# Patient Record
Sex: Male | Born: 1985 | Race: White | Hispanic: No | Marital: Single | Smoking: Never smoker
Health system: Southern US, Community
[De-identification: ages and names within clinical notes are randomized; demographics above are authoritative.]

## PROBLEM LIST (undated history)

## (undated) DIAGNOSIS — I471 Supraventricular tachycardia, unspecified: Secondary | ICD-10-CM

## (undated) DIAGNOSIS — Z952 Presence of prosthetic heart valve: Secondary | ICD-10-CM

## (undated) HISTORY — PX: CARDIAC SURGERY: SHX584

## (undated) HISTORY — PX: AORTIC VALVE REPLACEMENT: SHX41

---

## 2012-08-15 ENCOUNTER — Emergency Department: Payer: Self-pay | Admitting: Emergency Medicine

## 2012-08-16 LAB — BASIC METABOLIC PANEL
Anion Gap: 10 (ref 7–16)
BUN: 7 mg/dL (ref 7–18)
Chloride: 106 mmol/L (ref 98–107)
Co2: 26 mmol/L (ref 21–32)
EGFR (African American): >60
Glucose: 89 mg/dL (ref 65–99)
Osmolality: 281 (ref 275–301)
Sodium: 142 mmol/L (ref 136–145)

## 2012-08-16 LAB — MAGNESIUM: Magnesium: 1.7 mg/dL — ABNORMAL LOW

## 2012-08-16 LAB — PHOSPHORUS: Phosphorus: 3.4 mg/dL (ref 2.5–4.9)

## 2012-08-18 ENCOUNTER — Ambulatory Visit: Payer: Self-pay | Admitting: Family Medicine

## 2014-11-26 ENCOUNTER — Emergency Department: Payer: Self-pay | Admitting: Emergency Medicine

## 2014-11-26 LAB — COMPREHENSIVE METABOLIC PANEL
ALBUMIN: 3.7 g/dL (ref 3.4–5.0)
ALK PHOS: 85 U/L
ALT: 33 U/L
Anion Gap: 10 (ref 7–16)
BILIRUBIN TOTAL: 0.7 mg/dL (ref 0.2–1.0)
BUN: 9 mg/dL (ref 7–18)
CREATININE: 0.98 mg/dL (ref 0.60–1.30)
Calcium, Total: 8.9 mg/dL (ref 8.5–10.1)
Chloride: 98 mmol/L (ref 98–107)
Co2: 25 mmol/L (ref 21–32)
EGFR (African American): >60
EGFR (Non-African Amer.): >60
GLUCOSE: 102 mg/dL — AB (ref 65–99)
Osmolality: 265 (ref 275–301)
Potassium: 3.4 mmol/L — ABNORMAL LOW (ref 3.5–5.1)
SGOT(AST): 35 U/L (ref 15–37)
Sodium: 133 mmol/L — ABNORMAL LOW (ref 136–145)
TOTAL PROTEIN: 8.7 g/dL — AB (ref 6.4–8.2)

## 2014-11-26 LAB — CBC
HCT: 49.6 % (ref 40.0–52.0)
HGB: 16.9 g/dL (ref 13.0–18.0)
MCH: 31.2 pg (ref 26.0–34.0)
MCHC: 34.1 g/dL (ref 32.0–36.0)
MCV: 91 fL (ref 80–100)
Platelet: 221 10*3/uL (ref 150–440)
RBC: 5.42 10*6/uL (ref 4.40–5.90)
RDW: 13.1 % (ref 11.5–14.5)
WBC: 10.3 10*3/uL (ref 3.8–10.6)

## 2014-11-26 LAB — SEDIMENTATION RATE: Erythrocyte Sed Rate: 21 mm/hr — ABNORMAL HIGH (ref 0–15)

## 2016-01-05 ENCOUNTER — Encounter: Payer: Self-pay | Admitting: Urgent Care

## 2016-01-05 ENCOUNTER — Emergency Department
Admission: EM | Admit: 2016-01-05 | Discharge: 2016-01-05 | Disposition: A | Discharge status: Home / Self Care | Payer: Managed Care, Other (non HMO) | Attending: Emergency Medicine | Admitting: Emergency Medicine

## 2016-01-05 DIAGNOSIS — S0083XA Contusion of other part of head, initial encounter: Secondary | ICD-10-CM

## 2016-01-05 DIAGNOSIS — Y9289 Other specified places as the place of occurrence of the external cause: Secondary | ICD-10-CM | POA: Diagnosis not present

## 2016-01-05 DIAGNOSIS — S01511A Laceration without foreign body of lip, initial encounter: Secondary | ICD-10-CM | POA: Insufficient documentation

## 2016-01-05 DIAGNOSIS — Y998 Other external cause status: Secondary | ICD-10-CM | POA: Insufficient documentation

## 2016-01-05 DIAGNOSIS — Y9389 Activity, other specified: Secondary | ICD-10-CM | POA: Insufficient documentation

## 2016-01-05 DIAGNOSIS — S0993XA Unspecified injury of face, initial encounter: Secondary | ICD-10-CM | POA: Diagnosis present

## 2016-01-05 MED ORDER — LIDOCAINE HCL (PF) 1 % IJ SOLN: 5.0000 mL | Freq: Once | INTRAMUSCULAR | Status: DC

## 2016-01-05 MED ORDER — LIDOCAINE HCL (PF) 1 % IJ SOLN
INTRAMUSCULAR | Status: AC
  Filled 2016-01-05: qty 5
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
## 2016-01-05 NOTE — Discharge Instructions (Signed)
Mouth Laceration A mouth laceration is a deep cut in the lining of your mouth (mucosa). The laceration may extend into your lip or go all of the way through your mouth and cheek. Lacerations inside your mouth may involve your tongue, the insides of your cheeks, or the upper surface of your mouth (palate). Mouth lacerations may bleed a lot because your mouth has a very rich blood supply. Mouth lacerations may need to be repaired with stitches (sutures). CAUSES Any type of facial injury can cause a mouth laceration. Common causes include:  Getting hit in the mouth.  Being in a car accident. SYMPTOMS The most common sign of a mouth laceration is bleeding that fills the mouth. DIAGNOSIS Your health care provider can diagnose a mouth laceration by examining your mouth. Your mouth may need to be washed out (irrigated) with a sterile salt-water (saline) solution. Your health care provider may also have to remove any blood clots to determine how bad your injury is. You may need X-rays of the bones in your jaw or your face to rule out other injuries, such as dental injuries, facial fractures, or jaw fractures. TREATMENT Treatment depends on the location and severity of your injury. Small mouth lacerations may not need treatment if bleeding has stopped. You may need sutures if:  You have a tongue laceration.  Your mouth laceration is large or deep, or it continues to bleed. If sutures are necessary, your health care provider will use absorbable sutures that dissolve as your body heals. You may also receive antibiotic medicine or a tetanus shot. HOME CARE INSTRUCTIONS  Take medicines only as directed by your health care provider.  If you were prescribed an antibiotic medicine, finish all of it even if you start to feel better.  Eat as directed by your health care provider. You may only be able to drink liquids or eat soft foods for a few days.  Rinse your mouth with a warm, salt-water rinse 4-6  times per day or as directed by your health care provider. You can make a salt-water rinse by mixing one tsp of salt into two cups of warm water.  Do not poke the sutures with your tongue. Doing that can loosen them.  Check your wound every day for signs of infection. It is normal to have a white or gray patch over your wound while it heals. Watch for:  Redness.  Swelling.  Blood or pus.  Maintain regular oral hygiene, if possible. Gently brush your teeth with a soft, nylon-bristled toothbrush 2 times per day.  Keep all follow-up visits as directed by your health care provider. This is important. SEEK MEDICAL CARE IF:  You were given a tetanus shot and have swelling, severe pain, redness, or bleeding at the injection site.  You have a fever.  Your pain is not controlled with medicine.  You have redness, swelling, or pain at your wound that is getting worse.  You have fresh bleeding or pus coming from your wound.  The edges of your wound break open.  You develop swollen, tender glands in your throat. SEEK IMMEDIATE MEDICAL CARE IF:   Your face or the area under your jaw becomes swollen.  You have trouble breathing or swallowing.   This information is not intended to replace advice given to you by your health care provider. Make sure you discuss any questions you have with your health care provider.   Document Released: 10/20/2005 Document Revised: 03/06/2015 Document Reviewed: 10/11/2014 Elsevier Interactive Patient  Education 2016 [employer] Inc[employer]   Facial or Scalp Contusion A facial or scalp contusion is a deep bruise on the face or head. Injuries to the face and head generally cause a lot of swelling, especially around the eyes. Contusions are the result of an injury that caused bleeding under the skin. The contusion may turn blue, purple, or yellow. Minor injuries will give you a painless contusion, but more severe contusions may stay painful and swollen for a few weeks.    CAUSES  A facial or scalp contusion is caused by a blunt injury or trauma to the face or head area.  SIGNS AND SYMPTOMS   Swelling of the injured area.   Discoloration of the injured area.   Tenderness, soreness, or pain in the injured area.  DIAGNOSIS  The diagnosis can be made by taking a medical history and doing a physical exam. An X-ray exam, CT scan, or MRI may be needed to determine if there are any associated injuries, such as broken bones (fractures). TREATMENT  Often, the best treatment for a facial or scalp contusion is applying cold compresses to the injured area. Over-the-counter medicines may also be recommended for pain control.  HOME CARE INSTRUCTIONS   Only take over-the-counter or prescription medicines as directed by your health care provider.   Apply ice to the injured area.   Put ice in a plastic bag.   Place a towel between your skin and the bag.   Leave the ice on for 20 minutes, 2-3 times a day.  SEEK MEDICAL CARE IF:  You have bite problems.   You have pain with chewing.   You are concerned about facial defects. SEEK IMMEDIATE MEDICAL CARE IF:  You have severe pain or a headache that is not relieved by medicine.   You have unusual sleepiness, confusion, or personality changes.   You throw up (vomit).   You have a persistent nosebleed.   You have double vision or blurred vision.   You have fluid drainage from your nose or ear.   You have difficulty walking or using your arms or legs.  MAKE SURE YOU:   Understand these instructions.  Will watch your condition.  Will get help right away if you are not doing well or get worse.   This information is not intended to replace advice given to you by your health care provider. Make sure you discuss any questions you have with your health care provider.   Document Released: 11/27/2004 Document Revised: 11/10/2014 Document Reviewed: 06/02/2013 Elsevier Interactive Patient  Education 2016 [employer]  General Assault Assault includes any behavior or physical attack--whether it is on purpose or not--that results in injury to another person, damage to property, or both. This also includes assault that has not yet happened, but is planned to happen. Threats of assault may be physical, verbal, or written. They may be said or sent by:  Mail.  E-mail.  Text.  Social media.  Fax. The threats may be direct, implied, or understood. WHAT ARE THE DIFFERENT FORMS OF ASSAULT? Forms of assault include:  Physically assaulting a person. This includes physical threats to inflict physical harm as well as:  Slapping.  Hitting.  Poking.  Kicking.  Punching.  Pushing.  Sexually assaulting a person. Sexual assault is any sexual activity that a person is forced, threatened, or coerced to participate in. It may or may not involve physical contact with the person who is assaulting you. You are sexually assaulted if you are  forced to have sexual contact of any kind.  Damaging or destroying a person's assistive equipment, such as glasses, canes, or walkers.  Throwing or hitting objects.  Using or displaying a weapon to harm or threaten someone.  Using or displaying an object that appears to be a weapon in a threatening manner.  Using greater physical size or strength to intimidate someone.  Making intimidating or threatening gestures.  Bullying.  Hazing.  Using language that is intimidating, threatening, hostile, or abusive.  Stalking.  Restraining someone with force. WHAT SHOULD I DO IF I EXPERIENCE ASSAULT?  Report assaults, threats, and stalking to the police. Call your local emergency services (911 in the [place] if you are in immediate danger or you need medical help.  You can work with a Clinical research associate or an advocate to get legal protection against someone who has assaulted you or threatened you with assault. Protection includes restraining orders and  private addresses. Crimes against you, such as assault, can also be prosecuted through the courts. Laws will vary depending on where you live.   This information is not intended to replace advice given to you by your health care provider. Make sure you discuss any questions you have with your health care provider.   Document Released: 10/20/2005 Document Revised: 11/10/2014 Document Reviewed: 07/07/2014 Elsevier Interactive Patient Education [employer]

## 2016-01-05 NOTE — ED Notes (Addendum)
MD at bedside for suturing 
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
## 2016-01-31 ENCOUNTER — Ambulatory Visit: Payer: Self-pay | Admitting: Physician Assistant
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
## 2019-06-15 ENCOUNTER — Other Ambulatory Visit: Payer: Self-pay

## 2019-06-15 ENCOUNTER — Telehealth: Payer: Self-pay | Admitting: Internal Medicine

## 2019-06-15 DIAGNOSIS — Z20822 Contact with and (suspected) exposure to covid-19: Secondary | ICD-10-CM

## 2019-06-15 NOTE — Telephone Encounter (Signed)
.  newc

## 2019-06-15 NOTE — Telephone Encounter (Signed)
°  1. Do you have a fever? 99.2 2. Are you having chills? no 3. Do you have a sore throat? yes 4. Are you experiencing resp S/Sx -cough, SOB? cough 5. Do you have muscle aches? no 6. Are you experiencing N/V/D? N 7. Experiencing loss of sense of taste and/or smell? no 8. Do you have a headache? yes 9. Have you had contact with a person confirmed Positive for Covid-19? no 10. Have you traveled outside of ? [place], [place] July 15th  [employer] - He is at work.  Lives alone but also stays with girlfriend often. She is a Investment banker, operational.  Sx started yesterday and are getting worse.

## 2019-06-15 NOTE — Telephone Encounter (Signed)
Patient called, left message on home phone and then called a mobile number and he answered. message reviewed from earlier.  Sx's started late afternoon yesterday, throat scratchy, then this am temp-99.2 and mild nausea, + HA, still scratchy throat. Went to work and then sent home after he contacted [place]. Still HA, some PND, no body aches, no chills, no taste or smell loss, mild cough - NP. Is feeling a little worse today than yesterday.  Traveled to [facility] July 15th time for 3 days. Was out and about, wore a mask.  Lives alone, but often with significant other who works at [employer] as Marine scientist on [employer] and gets tested regularly. No sx's. She is at beach now, last contact with her Monday.  Also a coworker sent for a test yesterday.  Noted more likely an infectious source than allergic and the URI could be viral as most are, but do feel checking a test for Covid is indicated. He will head to the [facility] and have a test done. Rec'ed sx'ic measures presently with tylenol or ibuprofen prn for HA's or low grade temps, a mucinex or robitussin product prn and can add a flonase type product prn for congestion sx's. Rest and fluids and not return to work presently as await test results and until sx's are better as well. Will not be able to return the next couple days as a minimum.  Will be contacted from our office Friday to follow-up and hope to have the test result within 48 hours. Rec'ed he sign up for MyChart to help get results back as well.   Remain isolated presently until results back also rec'ed.  Note for work completed.

## 2019-06-15 NOTE — Telephone Encounter (Signed)
In the last 24 hours have you experienced any of these symptoms . Difficulty breathing - no  . Nasal congestion - yes . New cough - yes . Runny nose - no . Shortness of breath - no . New sore throat - yes . Unexplained body aches - no  . Nausea or vomiting yes nausea . Diarrhea no . Loss of taste or smell no  . Fever (temp>100 F/37.8 C) or chills 99.2 no chills  Exposure:  . Have you been in close contact with someone with confirmed diagnosis of COVID or person under going testing in past 14 days? no  . Have you been tested for COVID? If yes date, location, results in known no  . Have you been living in the same home as a person with confirmed diagnosis of COVID or a person undergoing testing? (household contact) no  . Have you been diagnosed with COVID or are you waiting on test results? no  . Have you traveled anywhere in the past 4 weeks? If yes where [place]  Lives alone stays with her Girlfriend a lot she is a Therapist, sports who works on Nature conservation officer. Sx started yesterday. Pt is at work now, sent home today during call. He states that he did not get text this morning from the city. Will send note to supervisor.

## 2019-06-16 LAB — NOVEL CORONAVIRUS, NAA: SARS-CoV-2, NAA: NOT DETECTED

## 2019-06-16 LAB — SPECIMEN STATUS REPORT

## 2019-06-17 NOTE — Telephone Encounter (Signed)
Called pt test is Neg in epic. Pt states that he feels better still has cough a little and scratchy throat, no fever. He hasn't taken anything since Wednesday.   Monday is his next scheduled day to work.

## 2019-06-17 NOTE — Telephone Encounter (Signed)
Pt aware note sent to supervisor, if not better or worse call on Monday.

## 2019-06-17 NOTE — Telephone Encounter (Signed)
Should be good to return to work Monday if continues to improve thru weekend. If not, to f/u.

## 2019-08-09 ENCOUNTER — Telehealth: Payer: Self-pay | Admitting: General Practice

## 2019-08-09 ENCOUNTER — Other Ambulatory Visit: Payer: Self-pay

## 2019-08-09 DIAGNOSIS — Z20822 Contact with and (suspected) exposure to covid-19: Secondary | ICD-10-CM

## 2019-08-09 NOTE — Telephone Encounter (Signed)
Telephoned patient.  States fever has gone up from 99.1 at 4am to 101 with taking Tylenol.  Advised patient to go for COVID testing at [facility] or [place] [facility].  Patient verbalized understanding.

## 2019-08-09 NOTE — Telephone Encounter (Signed)
In the last 24 hours have you experienced any of these symptoms  Difficulty breathing  no  Nasal congestion  yes  New cough  yes  Runny nose  no  Shortness of breath  yes  New sore throat  yes  Unexplained body aches  yes  Nausea or vomiting  no  Diarrhea  no  Loss of taste or smell  no  Fever (temp>100 F/37.8 C) or chills  100.7  No chills  Exposure:   Have you been in close contact with someone with confirmed diagnosis of COVID or person under going testing in past 14 days?  no   Have you been tested for COVID? If yes date, location, results in known  Yes neg [facility] ago   Have you been living in the same home as a person with confirmed diagnosis of COVID or a person undergoing testing? (household contact)  no   Have you been diagnosed with COVID or are you waiting on test results? no   Have you traveled anywhere in the past 4 weeks? If yes where  [place]  [place]  Live with roommate he also has sx  Sx started yesterday. He states it started with a scratchy throat. He felt fine till around bed time. He states he didn't sleep at all and feels much worse this morning the other sx began. OTC: for fever (Tylenol)

## 2019-08-11 LAB — NOVEL CORONAVIRUS, NAA: SARS-CoV-2, NAA: NOT DETECTED

## 2019-08-15 ENCOUNTER — Telehealth: Payer: Self-pay | Admitting: General Practice

## 2019-08-18 NOTE — Telephone Encounter (Signed)
error 

## 2019-10-21 DIAGNOSIS — R030 Elevated blood-pressure reading, without diagnosis of hypertension: Secondary | ICD-10-CM | POA: Diagnosis not present

## 2019-10-21 DIAGNOSIS — Z03818 Encounter for observation for suspected exposure to other biological agents ruled out: Secondary | ICD-10-CM | POA: Diagnosis not present

## 2019-10-21 DIAGNOSIS — Z20828 Contact with and (suspected) exposure to other viral communicable diseases: Secondary | ICD-10-CM | POA: Diagnosis not present

## 2019-11-01 DIAGNOSIS — M545 Low back pain: Secondary | ICD-10-CM | POA: Diagnosis not present

## 2019-11-01 DIAGNOSIS — R6889 Other general symptoms and signs: Secondary | ICD-10-CM | POA: Diagnosis not present

## 2019-11-01 DIAGNOSIS — U071 COVID-19: Secondary | ICD-10-CM | POA: Diagnosis not present

## 2019-11-01 DIAGNOSIS — R05 Cough: Secondary | ICD-10-CM | POA: Diagnosis not present

## 2020-03-25 DIAGNOSIS — S01411A Laceration without foreign body of right cheek and temporomandibular area, initial encounter: Secondary | ICD-10-CM | POA: Diagnosis not present

## 2022-06-02 ENCOUNTER — Ambulatory Visit: Payer: Self-pay

## 2022-06-02 DIAGNOSIS — Z1339 Encounter for screening examination for other mental health and behavioral disorders: Secondary | ICD-10-CM

## 2022-06-02 NOTE — Progress Notes (Signed)
Received DUI Friday night.  Presents to [facility] for follow-up Non-DOT breath alcohol screening accompanied by supervisor Delfina Redwood) per [facility] Policy & Procedure.  Non-DOT Breath Alcohol Results = .000  Supervisor informed of results.  AMD

## 2022-09-10 ENCOUNTER — Encounter: Payer: Self-pay | Admitting: Emergency Medicine

## 2022-09-10 ENCOUNTER — Other Ambulatory Visit: Payer: Self-pay

## 2022-09-10 ENCOUNTER — Emergency Department: Payer: 59

## 2022-09-10 DIAGNOSIS — R600 Localized edema: Secondary | ICD-10-CM | POA: Diagnosis not present

## 2022-09-10 DIAGNOSIS — R2231 Localized swelling, mass and lump, right upper limb: Secondary | ICD-10-CM | POA: Diagnosis not present

## 2022-09-10 DIAGNOSIS — M25531 Pain in right wrist: Secondary | ICD-10-CM | POA: Diagnosis not present

## 2022-09-10 NOTE — ED Triage Notes (Signed)
Patient ambulatory to triage with steady gait, without difficulty or distress noted; pt reports rt wrist pain since last night with no specific injury

## 2022-09-11 ENCOUNTER — Emergency Department
Admission: EM | Admit: 2022-09-11 | Discharge: 2022-09-11 | Disposition: A | Discharge status: Home / Self Care | Payer: 59 | Attending: Emergency Medicine | Admitting: Emergency Medicine

## 2022-09-11 DIAGNOSIS — M25531 Pain in right wrist: Secondary | ICD-10-CM

## 2022-09-11 MED ORDER — KETOROLAC TROMETHAMINE 60 MG/2ML IM SOLN
30.0000 mg | Freq: Once | INTRAMUSCULAR | Status: AC
  Administered 2022-09-11: 30 mg via INTRAMUSCULAR
  Filled 2022-09-11: qty 2

## 2022-09-11 MED ORDER — OXYCODONE-ACETAMINOPHEN 5-325 MG PO TABS
1.0000 | ORAL_TABLET | Freq: Once | ORAL | Status: AC
  Administered 2022-09-11: 1 via ORAL
  Filled 2022-09-11: qty 1

## 2022-09-11 MED ORDER — OXYCODONE-ACETAMINOPHEN 5-325 MG PO TABS: 1.0000 | ORAL_TABLET | ORAL | 0 refills | Status: DC | PRN

## 2022-09-11 MED ORDER — IBUPROFEN 800 MG PO TABS: 800.0000 mg | ORAL_TABLET | Freq: Three times a day (TID) | ORAL | 0 refills | Status: DC | PRN

## 2022-09-11 NOTE — ED Provider Notes (Signed)
[facility] Provider Note    Event Date/Time   First MD Initiated Contact with Patient 09/11/22 [phone]     (approximate)   History   Wrist Pain   HPI  Richard Juarez is a 36 y.o. male  who presents to the ED from home with a chief complaint of nontraumatic right wrist pain. History of right wrist fracture in hight school. Denies trauma; states he awoke with pain and swelling to his right wrist. Works at [employer]; states some repetitive motions and heavy lifting. Voices no other complaints or injuries.       Past Medical History  History reviewed. No pertinent past medical history.   Active Problem List  There are no problems to display for this patient.    Past Surgical History   Past Surgical History:  Procedure Laterality Date   CARDIAC SURGERY     as a child     Home Medications   Prior to Admission medications   Medication Sig Start Date End Date Taking? Authorizing Provider  ibuprofen (ADVIL) 800 MG tablet Take 1 tablet (800 mg total) by mouth every 8 (eight) hours as needed for moderate pain. 09/11/22  Yes Irean Hong, MD  oxyCODONE-acetaminophen (PERCOCET/ROXICET) 5-325 MG tablet Take 1 tablet by mouth every 4 (four) hours as needed for severe pain. 09/11/22  Yes Irean Hong, MD     Allergies  Patient has no known allergies.   Family History  No family history on file.   Physical Exam  Triage Vital Signs: ED Triage Vitals  Enc Vitals Group     BP 09/10/22 2330 (!) 161/102     Pulse Rate 09/10/22 2330 97     Resp 09/10/22 2330 20     Temp 09/10/22 2330 98.4 F (36.9 C)     Temp Source 09/10/22 2330 Oral     SpO2 09/10/22 2330 99 %     Weight 09/10/22 2328 265 lb (120.2 kg)     Height 09/10/22 2328 6\' 7"  (2.007 m)     Head Circumference --      Peak Flow --      Pain Score 09/10/22 2328 8     Pain Loc --      Pain Edu? --      Excl. in GC? --     Updated Vital Signs: BP (!) 161/102 (BP Location:  Left Arm) Comment: pt states he has white coat syndrome  Pulse 97   Temp 98.4 F (36.9 C) (Oral)   Resp 20   Ht 6\' 7"  (2.007 m)   Wt 120.2 kg   SpO2 99%   BMI 29.85 kg/m    General: Awake, no distress.  CV:  Good peripheral perfusion.  Resp:  Normal effort.  Abd:  No distention.  Other:  Right wrist with mild swelling and tender to palpation especially over scaphoid. Limited ROM secondary to pain. 2+ radial pulses. Brisk, less than 5 second capillary refill.   ED Results / Procedures / Treatments  Labs (all labs ordered are listed, but only abnormal results are displayed) Labs Reviewed - No data to display   EKG  None   RADIOLOGY I have independently visualized and interpreted patient's xray as well as noted the radiology interpretation:  Right wrist xray: Mild generalized soft tissue edema; no acute osseous injury  Official radiology report(s): DG Wrist Complete Right  Result Date: 09/10/2022 CLINICAL DATA:  Right wrist pain and swelling. Patient reports  prior injury EXAM: RIGHT WRIST - COMPLETE 3+ VIEW COMPARISON:  None Available. FINDINGS: Remote scaphoid fracture with nonunion. Corticated projection from the distal scaphoid pole likely sequela of remote injury. There is also remote healed fracture of the third metacarpal. No acute fracture. Slight radiocarpal joint space narrowing. Joint spaces are otherwise preserved. There is mild carpal bone spurring and subchondral cysts. No erosive change. Mild generalized edema. IMPRESSION: 1. Mild generalized soft tissue edema. 2. Remote scaphoid fracture with nonunion. Remote healed third metacarpal fracture. Electronically Signed   By: Narda Rutherford M.D.   On: 09/10/2022 23:55     PROCEDURES:  Critical Care performed: No  Procedures   MEDICATIONS ORDERED IN ED: Medications  ketorolac (TORADOL) injection 30 mg (has no administration in time range)  oxyCODONE-acetaminophen (PERCOCET/ROXICET) 5-325 MG per tablet 1  tablet (has no administration in time range)     IMPRESSION / MDM / ASSESSMENT AND PLAN / ED COURSE  I reviewed the triage vital signs and the nursing notes.                             36 year old male presenting with nontraumatic right wrist pain. Xrays negative for acute process. Will administer NSAIDs, analgesia, place in velcro wrist splint and patient will follow up orthopedics as needed. Strict return precautions given. Patient verbalizes understanding and agrees with plan of care.  Patient's presentation is most consistent with acute, uncomplicated illness.   FINAL CLINICAL IMPRESSION(S) / ED DIAGNOSES   Final diagnoses:  Acute pain of right wrist     Rx / DC Orders   ED Discharge Orders          Ordered    ibuprofen (ADVIL) 800 MG tablet  Every 8 hours PRN        09/11/22 0045    oxyCODONE-acetaminophen (PERCOCET/ROXICET) 5-325 MG tablet  Every 4 hours PRN        09/11/22 0045             Note:  This document was prepared using Dragon voice recognition software and may include unintentional dictation errors.   Irean Hong, MD 09/11/22 [phone]

## 2022-09-11 NOTE — ED Notes (Signed)
E-signature pad unavailable - Pt verbalized understanding of D/C information - no additional concerns at this time.  

## 2022-09-11 NOTE — Discharge Instructions (Signed)
You may take pain medicines as needed (Motrin/Percocet #15). Apply ice over splint several times daily. Wear wrist splint as needed for comfort. Return to the ER for worsening symptoms or other concerns.

## 2022-10-25 ENCOUNTER — Emergency Department
Admission: EM | Admit: 2022-10-25 | Discharge: 2022-10-26 | Disposition: A | Discharge status: Home / Self Care | Payer: 59 | Attending: Emergency Medicine | Admitting: Emergency Medicine

## 2022-10-25 ENCOUNTER — Other Ambulatory Visit: Payer: Self-pay

## 2022-10-25 DIAGNOSIS — T50901A Poisoning by unspecified drugs, medicaments and biological substances, accidental (unintentional), initial encounter: Secondary | ICD-10-CM | POA: Diagnosis not present

## 2022-10-25 DIAGNOSIS — E876 Hypokalemia: Secondary | ICD-10-CM | POA: Insufficient documentation

## 2022-10-25 DIAGNOSIS — R69 Illness, unspecified: Secondary | ICD-10-CM | POA: Diagnosis not present

## 2022-10-25 DIAGNOSIS — T405X1A Poisoning by cocaine, accidental (unintentional), initial encounter: Secondary | ICD-10-CM | POA: Diagnosis present

## 2022-10-25 DIAGNOSIS — T50904A Poisoning by unspecified drugs, medicaments and biological substances, undetermined, initial encounter: Secondary | ICD-10-CM | POA: Diagnosis not present

## 2022-10-25 DIAGNOSIS — R404 Transient alteration of awareness: Secondary | ICD-10-CM | POA: Diagnosis not present

## 2022-10-25 DIAGNOSIS — T887XXA Unspecified adverse effect of drug or medicament, initial encounter: Secondary | ICD-10-CM | POA: Diagnosis not present

## 2022-10-25 DIAGNOSIS — R0902 Hypoxemia: Secondary | ICD-10-CM | POA: Diagnosis not present

## 2022-10-25 DIAGNOSIS — R231 Pallor: Secondary | ICD-10-CM | POA: Diagnosis not present

## 2022-10-25 LAB — CBC WITH DIFFERENTIAL/PLATELET
Abs Immature Granulocytes: 0.06 10*3/uL (ref 0.00–0.07)
Basophils Absolute: 0.1 10*3/uL (ref 0.0–0.1)
Basophils Relative: 1 %
Eosinophils Absolute: 0.1 10*3/uL (ref 0.0–0.5)
Eosinophils Relative: 1 %
HCT: 43.1 % (ref 39.0–52.0)
Hemoglobin: 14.6 g/dL (ref 13.0–17.0)
Immature Granulocytes: 1 %
Lymphocytes Relative: 15 %
Lymphs Abs: 1.2 10*3/uL (ref 0.7–4.0)
MCH: 30.8 pg (ref 26.0–34.0)
MCHC: 33.9 g/dL (ref 30.0–36.0)
MCV: 90.9 fL (ref 80.0–100.0)
Monocytes Absolute: 0.5 10*3/uL (ref 0.1–1.0)
Monocytes Relative: 6 %
Neutro Abs: 6.1 10*3/uL (ref 1.7–7.7)
Neutrophils Relative %: 76 %
Platelets: 211 10*3/uL (ref 150–400)
RBC: 4.74 MIL/uL (ref 4.22–5.81)
RDW: 12.2 % (ref 11.5–15.5)
WBC: 8.1 10*3/uL (ref 4.0–10.5)
nRBC: 0 % (ref 0.0–0.2)

## 2022-10-25 MED ORDER — LACTATED RINGERS IV BOLUS
1000.0000 mL | Freq: Once | INTRAVENOUS | Status: AC
  Administered 2022-10-25: 1000 mL via INTRAVENOUS

## 2022-10-25 NOTE — ED Provider Notes (Signed)
   [facility] Provider Note    Event Date/Time   First MD Initiated Contact with Patient 10/25/22 2315     (approximate)   History   No chief complaint on file.   HPI  Richard Juarez is a 36 y.o. male who presents to the ED for evaluation of No chief complaint on file.      Physical Exam   Triage Vital Signs: ED Triage Vitals  Enc Vitals Group     BP      Pulse      Resp      Temp      Temp src      SpO2      Weight      Height      Head Circumference      Peak Flow      Pain Score      Pain Loc      Pain Edu?      Excl. in GC?     Most recent vital signs: There were no vitals filed for this visit.  General: Awake, no distress. *** CV:  Good peripheral perfusion.  Resp:  Normal effort.  Abd:  No distention.  MSK:  No deformity noted.  Neuro:  No focal deficits appreciated. Other:     ED Results / Procedures / Treatments   Labs (all labs ordered are listed, but only abnormal results are displayed) Labs Reviewed - No data to display  EKG Sinus rhythm with a rate of 98 bpm.  Normal axis and right bundle branch block.  Stigmata of LVH.  Prolonged QTc at 528 ms.  Some nonspecific ST changes to aVL, V3 but no clear signs of acute ischemia.  Comparison from January 2016 is quite similar with right bundle branch block and some nonspecific ST changes that are of just a slightly different morphology.  Overall very similar and reassuring  RADIOLOGY ***  Official radiology report(s): No results found.  PROCEDURES and INTERVENTIONS:  Procedures  Medications - No data to display   IMPRESSION / MDM / ASSESSMENT AND PLAN / ED COURSE  I reviewed the triage vital signs and the nursing notes.  Differential diagnosis includes, but is not limited to, ***  {Patient presents with symptoms of an acute illness or injury that is potentially life-threatening.}      FINAL CLINICAL IMPRESSION(S) / ED DIAGNOSES   Final diagnoses:   None     Rx / DC Orders   ED Discharge Orders     None        Note:  This document was prepared using Dragon voice recognition software and may include unintentional dictation errors.

## 2022-10-25 NOTE — ED Triage Notes (Signed)
Pt arrives via [facility], C/O d/t overdose.  Pt admits to cocaine 4-5 bumps today and ETOH.  Per EMS upon arrival pt gray in color and diaphoretic, unresponsive.   RA sats in 70s.  EMS gave narcan and pt improved.  O2 sats improved to 98%RA.  Pt A&Ox4 at this time.  Color WNL, denies pain, N/V.

## 2022-10-25 NOTE — ED Notes (Signed)
Waiver of medical screening exam signed on paper and placed in shadow chart to scan.

## 2022-10-26 LAB — BASIC METABOLIC PANEL
Anion gap: 10 (ref 5–15)
BUN: 10 mg/dL (ref 6–20)
CO2: 19 mmol/L — ABNORMAL LOW (ref 22–32)
Calcium: 8.1 mg/dL — ABNORMAL LOW (ref 8.9–10.3)
Chloride: 107 mmol/L (ref 98–111)
Creatinine, Ser: 0.83 mg/dL (ref 0.61–1.24)
GFR, Estimated: >60 mL/min (ref >60)
Glucose, Bld: 110 mg/dL — ABNORMAL HIGH (ref 70–99)
Potassium: 3.1 mmol/L — ABNORMAL LOW (ref 3.5–5.1)
Sodium: 136 mmol/L (ref 135–145)

## 2022-10-26 LAB — MAGNESIUM: Magnesium: 2.1 mg/dL (ref 1.7–2.4)

## 2022-10-26 MED ORDER — POTASSIUM CHLORIDE CRYS ER 20 MEQ PO TBCR
40.0000 meq | EXTENDED_RELEASE_TABLET | Freq: Once | ORAL | Status: AC
  Administered 2022-10-26: 40 meq via ORAL
  Filled 2022-10-26: qty 2

## 2022-10-26 NOTE — ED Notes (Signed)
Pt's friend had dropped of a phone,wallet, and car keys to the front desk staff. Writer returned items to pt

## 2022-11-04 ENCOUNTER — Other Ambulatory Visit: Payer: Self-pay

## 2022-11-04 NOTE — Progress Notes (Signed)
Urine drug screen completed and BAT per [facility] policy after consent signed for both.

## 2023-05-03 ENCOUNTER — Inpatient Hospital Stay (HOSPITAL_COMMUNITY)
Admit: 2023-05-03 | Discharge: 2023-05-03 | Disposition: A | Discharge status: Home / Self Care | Payer: Self-pay | Attending: Cardiology | Admitting: Cardiology

## 2023-05-03 ENCOUNTER — Inpatient Hospital Stay
Admission: EM | Admit: 2023-05-03 | Discharge: 2023-05-05 | DRG: 286 | Disposition: A | Discharge status: Other Acute Inpatient Hospital | Payer: Self-pay | Attending: Internal Medicine | Admitting: Internal Medicine

## 2023-05-03 ENCOUNTER — Other Ambulatory Visit: Payer: Self-pay

## 2023-05-03 ENCOUNTER — Emergency Department: Payer: Self-pay

## 2023-05-03 DIAGNOSIS — R0609 Other forms of dyspnea: Secondary | ICD-10-CM | POA: Diagnosis present

## 2023-05-03 DIAGNOSIS — I5033 Acute on chronic diastolic (congestive) heart failure: Secondary | ICD-10-CM | POA: Diagnosis present

## 2023-05-03 DIAGNOSIS — F909 Attention-deficit hyperactivity disorder, unspecified type: Secondary | ICD-10-CM | POA: Diagnosis present

## 2023-05-03 DIAGNOSIS — Z1152 Encounter for screening for COVID-19: Secondary | ICD-10-CM

## 2023-05-03 DIAGNOSIS — Z8249 Family history of ischemic heart disease and other diseases of the circulatory system: Secondary | ICD-10-CM

## 2023-05-03 DIAGNOSIS — Z953 Presence of xenogenic heart valve: Secondary | ICD-10-CM

## 2023-05-03 DIAGNOSIS — I408 Other acute myocarditis: Secondary | ICD-10-CM

## 2023-05-03 DIAGNOSIS — T82857A Stenosis of cardiac prosthetic devices, implants and grafts, initial encounter: Secondary | ICD-10-CM | POA: Diagnosis not present

## 2023-05-03 DIAGNOSIS — F319 Bipolar disorder, unspecified: Secondary | ICD-10-CM | POA: Diagnosis present

## 2023-05-03 DIAGNOSIS — J810 Acute pulmonary edema: Secondary | ICD-10-CM

## 2023-05-03 DIAGNOSIS — E663 Overweight: Secondary | ICD-10-CM | POA: Insufficient documentation

## 2023-05-03 DIAGNOSIS — I5021 Acute systolic (congestive) heart failure: Secondary | ICD-10-CM | POA: Diagnosis present

## 2023-05-03 DIAGNOSIS — Y832 Surgical operation with anastomosis, bypass or graft as the cause of abnormal reaction of the patient, or of later complication, without mention of misadventure at the time of the procedure: Secondary | ICD-10-CM | POA: Diagnosis present

## 2023-05-03 DIAGNOSIS — I214 Non-ST elevation (NSTEMI) myocardial infarction: Principal | ICD-10-CM | POA: Diagnosis present

## 2023-05-03 DIAGNOSIS — Y831 Surgical operation with implant of artificial internal device as the cause of abnormal reaction of the patient, or of later complication, without mention of misadventure at the time of the procedure: Secondary | ICD-10-CM | POA: Diagnosis present

## 2023-05-03 DIAGNOSIS — Z8616 Personal history of COVID-19: Secondary | ICD-10-CM | POA: Diagnosis not present

## 2023-05-03 DIAGNOSIS — I452 Bifascicular block: Secondary | ICD-10-CM | POA: Diagnosis present

## 2023-05-03 DIAGNOSIS — R7989 Other specified abnormal findings of blood chemistry: Secondary | ICD-10-CM | POA: Diagnosis present

## 2023-05-03 DIAGNOSIS — I4719 Other supraventricular tachycardia: Secondary | ICD-10-CM | POA: Diagnosis present

## 2023-05-03 DIAGNOSIS — Z6828 Body mass index (BMI) 28.0-28.9, adult: Secondary | ICD-10-CM

## 2023-05-03 DIAGNOSIS — E876 Hypokalemia: Secondary | ICD-10-CM | POA: Diagnosis present

## 2023-05-03 DIAGNOSIS — I471 Supraventricular tachycardia, unspecified: Secondary | ICD-10-CM | POA: Diagnosis present

## 2023-05-03 DIAGNOSIS — I509 Heart failure, unspecified: Secondary | ICD-10-CM

## 2023-05-03 DIAGNOSIS — I340 Nonrheumatic mitral (valve) insufficiency: Secondary | ICD-10-CM

## 2023-05-03 DIAGNOSIS — I350 Nonrheumatic aortic (valve) stenosis: Secondary | ICD-10-CM

## 2023-05-03 HISTORY — DX: Supraventricular tachycardia, unspecified: I47.10

## 2023-05-03 HISTORY — DX: Presence of prosthetic heart valve: Z95.2

## 2023-05-03 LAB — CBC WITH DIFFERENTIAL/PLATELET
Abs Immature Granulocytes: 0.04 10*3/uL (ref 0.00–0.07)
Basophils Absolute: 0.1 10*3/uL (ref 0.0–0.1)
Basophils Relative: 1 %
Eosinophils Absolute: 0.1 10*3/uL (ref 0.0–0.5)
Eosinophils Relative: 1 %
HCT: 44.7 % (ref 39.0–52.0)
Hemoglobin: 14.9 g/dL (ref 13.0–17.0)
Immature Granulocytes: 0 %
Lymphocytes Relative: 12 %
Lymphs Abs: 1.1 10*3/uL (ref 0.7–4.0)
MCH: 30.1 pg (ref 26.0–34.0)
MCHC: 33.3 g/dL (ref 30.0–36.0)
MCV: 90.3 fL (ref 80.0–100.0)
Monocytes Absolute: 0.8 10*3/uL (ref 0.1–1.0)
Monocytes Relative: 8 %
Neutro Abs: 7.6 10*3/uL (ref 1.7–7.7)
Neutrophils Relative %: 78 %
Platelets: 212 10*3/uL (ref 150–400)
RBC: 4.95 MIL/uL (ref 4.22–5.81)
RDW: 13.2 % (ref 11.5–15.5)
WBC: 9.8 10*3/uL (ref 4.0–10.5)
nRBC: 0 % (ref 0.0–0.2)

## 2023-05-03 LAB — COMPREHENSIVE METABOLIC PANEL
ALT: 21 U/L (ref 0–44)
AST: 24 U/L (ref 15–41)
Albumin: 4 g/dL (ref 3.5–5.0)
Alkaline Phosphatase: 85 U/L (ref 38–126)
Anion gap: 9 (ref 5–15)
BUN: 14 mg/dL (ref 6–20)
CO2: 24 mmol/L (ref 22–32)
Calcium: 8.8 mg/dL — ABNORMAL LOW (ref 8.9–10.3)
Chloride: 106 mmol/L (ref 98–111)
Creatinine, Ser: 0.8 mg/dL (ref 0.61–1.24)
GFR, Estimated: >60 mL/min (ref >60)
Glucose, Bld: 127 mg/dL — ABNORMAL HIGH (ref 70–99)
Potassium: 3.7 mmol/L (ref 3.5–5.1)
Sodium: 139 mmol/L (ref 135–145)
Total Bilirubin: 1.1 mg/dL (ref 0.3–1.2)
Total Protein: 7.2 g/dL (ref 6.5–8.1)

## 2023-05-03 LAB — ECHOCARDIOGRAM COMPLETE
AR max vel: 0.57 cm2
AV Area VTI: 0.55 cm2
AV Area mean vel: 0.5 cm2
AV Mean grad: 59 mmHg
AV Peak grad: 93.3 mmHg
Ao pk vel: 4.83 m/s
Area-P 1/2: 5.97 cm2
Height: 79 in
P 1/2 time: 247 msec
S' Lateral: 3.9 cm
Weight: 4328 oz

## 2023-05-03 LAB — TROPONIN I (HIGH SENSITIVITY)
Troponin I (High Sensitivity): 118 ng/L (ref <18)
Troponin I (High Sensitivity): 169 ng/L (ref <18)
Troponin I (High Sensitivity): 100 ng/L (ref <18)
Troponin I (High Sensitivity): 76 ng/L — ABNORMAL HIGH (ref <18)
Troponin I (High Sensitivity): 75 ng/L — ABNORMAL HIGH (ref <18)

## 2023-05-03 LAB — MAGNESIUM: Magnesium: 1.8 mg/dL (ref 1.7–2.4)

## 2023-05-03 LAB — SARS CORONAVIRUS 2 BY RT PCR: SARS Coronavirus 2 by RT PCR: NEGATIVE

## 2023-05-03 LAB — PROTIME-INR
INR: 1 (ref 0.8–1.2)
Prothrombin Time: 13.4 seconds (ref 11.4–15.2)

## 2023-05-03 LAB — APTT
aPTT: 29 seconds (ref 24–36)
aPTT: 38 seconds — ABNORMAL HIGH (ref 24–36)

## 2023-05-03 LAB — BRAIN NATRIURETIC PEPTIDE: B Natriuretic Peptide: 611.3 pg/mL — ABNORMAL HIGH (ref 0.0–100.0)

## 2023-05-03 LAB — HEPARIN LEVEL (UNFRACTIONATED): Heparin Unfractionated: 0.16 IU/mL — ABNORMAL LOW (ref 0.30–0.70)

## 2023-05-03 MED ORDER — POTASSIUM CHLORIDE CRYS ER 20 MEQ PO TBCR
40.0000 meq | EXTENDED_RELEASE_TABLET | Freq: Once | ORAL | Status: AC
  Administered 2023-05-03: 40 meq via ORAL
  Filled 2023-05-03: qty 2

## 2023-05-03 MED ORDER — DIPHENHYDRAMINE HCL 50 MG/ML IJ SOLN: 12.5000 mg | Freq: Three times a day (TID) | INTRAMUSCULAR | Status: DC | PRN

## 2023-05-03 MED ORDER — FUROSEMIDE 10 MG/ML IJ SOLN
20.0000 mg | Freq: Two times a day (BID) | INTRAMUSCULAR | Status: DC
  Administered 2023-05-03 – 2023-05-05 (×5): 20 mg via INTRAVENOUS
  Filled 2023-05-03 (×3): qty 2
  Filled 2023-05-03: qty 4
  Filled 2023-05-03: qty 2

## 2023-05-03 MED ORDER — ACETAMINOPHEN 325 MG PO TABS: 650.0000 mg | ORAL_TABLET | Freq: Four times a day (QID) | ORAL | Status: DC | PRN

## 2023-05-03 MED ORDER — METHYLPREDNISOLONE SODIUM SUCC 125 MG IJ SOLR
125.0000 mg | Freq: Once | INTRAMUSCULAR | Status: AC
  Administered 2023-05-03: 125 mg via INTRAVENOUS
  Filled 2023-05-03: qty 2

## 2023-05-03 MED ORDER — ASPIRIN 81 MG PO TBEC
81.0000 mg | DELAYED_RELEASE_TABLET | Freq: Every day | ORAL | Status: DC
  Administered 2023-05-04 – 2023-05-05 (×2): 81 mg via ORAL
  Filled 2023-05-03 (×2): qty 1

## 2023-05-03 MED ORDER — ENOXAPARIN SODIUM 60 MG/0.6ML IJ SOSY
60.0000 mg | PREFILLED_SYRINGE | Freq: Every day | INTRAMUSCULAR | Status: DC
  Administered 2023-05-03 – 2023-05-05 (×3): 60 mg via SUBCUTANEOUS
  Filled 2023-05-03 (×3): qty 0.6

## 2023-05-03 MED ORDER — SODIUM CHLORIDE 0.9 % IV SOLN: 250.0000 mL | INTRAVENOUS | Status: DC | PRN

## 2023-05-03 MED ORDER — HEPARIN BOLUS VIA INFUSION
3500.0000 [IU] | Freq: Once | INTRAVENOUS | Status: AC
  Administered 2023-05-03: 3500 [IU] via INTRAVENOUS
  Filled 2023-05-03: qty 3500

## 2023-05-03 MED ORDER — ATORVASTATIN CALCIUM 20 MG PO TABS
40.0000 mg | ORAL_TABLET | Freq: Every day | ORAL | Status: DC
  Administered 2023-05-03 – 2023-05-05 (×3): 40 mg via ORAL
  Filled 2023-05-03 (×4): qty 2

## 2023-05-03 MED ORDER — METOPROLOL SUCCINATE ER 50 MG PO TB24
100.0000 mg | ORAL_TABLET | Freq: Once | ORAL | Status: AC
  Administered 2023-05-03: 100 mg via ORAL
  Filled 2023-05-03: qty 2

## 2023-05-03 MED ORDER — IOHEXOL 350 MG/ML SOLN
100.0000 mL | Freq: Once | INTRAVENOUS | Status: AC | PRN
  Administered 2023-05-03: 100 mL via INTRAVENOUS

## 2023-05-03 MED ORDER — SODIUM CHLORIDE 0.9 % IV SOLN: INTRAVENOUS | Status: DC

## 2023-05-03 MED ORDER — IPRATROPIUM-ALBUTEROL 0.5-2.5 (3) MG/3ML IN SOLN
6.0000 mL | Freq: Once | RESPIRATORY_TRACT | Status: AC
  Administered 2023-05-03: 6 mL via RESPIRATORY_TRACT
  Filled 2023-05-03: qty 3

## 2023-05-03 MED ORDER — HEPARIN BOLUS VIA INFUSION
4000.0000 [IU] | Freq: Once | INTRAVENOUS | Status: AC
  Administered 2023-05-03: 4000 [IU] via INTRAVENOUS
  Filled 2023-05-03: qty 4000

## 2023-05-03 MED ORDER — SODIUM CHLORIDE 0.9% FLUSH: 3.0000 mL | INTRAVENOUS | Status: DC | PRN

## 2023-05-03 MED ORDER — DM-GUAIFENESIN ER 30-600 MG PO TB12: 1.0000 | ORAL_TABLET | Freq: Two times a day (BID) | ORAL | Status: DC | PRN

## 2023-05-03 MED ORDER — ASPIRIN 81 MG PO CHEW
324.0000 mg | CHEWABLE_TABLET | Freq: Once | ORAL | Status: AC
  Administered 2023-05-03: 324 mg via ORAL
  Filled 2023-05-03: qty 4

## 2023-05-03 MED ORDER — FUROSEMIDE 10 MG/ML IJ SOLN: 40.0000 mg | Freq: Two times a day (BID) | INTRAMUSCULAR | Status: DC

## 2023-05-03 MED ORDER — ALBUTEROL SULFATE (2.5 MG/3ML) 0.083% IN NEBU: 2.5000 mg | INHALATION_SOLUTION | RESPIRATORY_TRACT | Status: DC | PRN

## 2023-05-03 MED ORDER — HEPARIN (PORCINE) 25000 UT/250ML-% IV SOLN
1900.0000 [IU]/h | INTRAVENOUS | Status: DC
  Administered 2023-05-03: 1600 [IU]/h via INTRAVENOUS
  Filled 2023-05-03: qty 250

## 2023-05-03 MED ORDER — HEPARIN SODIUM (PORCINE) 5000 UNIT/ML IJ SOLN: 60.0000 [IU]/kg | Freq: Once | INTRAMUSCULAR | Status: DC

## 2023-05-03 MED ORDER — IVABRADINE HCL 5 MG PO TABS: 15.0000 mg | ORAL_TABLET | Freq: Once | ORAL | Status: DC

## 2023-05-03 MED ORDER — METOPROLOL TARTRATE 50 MG PO TABS: 100.0000 mg | ORAL_TABLET | Freq: Once | ORAL | Status: DC

## 2023-05-03 MED ORDER — SODIUM CHLORIDE 0.9% FLUSH
3.0000 mL | Freq: Two times a day (BID) | INTRAVENOUS | Status: DC
  Administered 2023-05-03 – 2023-05-05 (×3): 3 mL via INTRAVENOUS

## 2023-05-03 NOTE — Consult Note (Signed)
Cardiology Consultation   Patient ID: ELAI FARRER MRN: [id number]; DOB: 1985-12-21  Admit date: 05/03/2023 Date of Consult: 05/03/2023  PCP:  Patient, No Pcp Per   [facility] Providers Cardiologist:  New  Patient Profile:   Richard Juarez is a 37 y.o. male with a hx of aortic valve replacement (congenital heart defect), pSVT, bipolar and ADHD who is being seen 05/03/2023 for the evaluation of chest pain and shortness of breath at the request of Dr. Clyde Lundborg.  History of Present Illness:   Mr. Sydow reported history of a valve replacement in 2008 at [facility]. He has a congenital heart defect and has undergone 3 separate cardiac procedure, the last of which was 2008. He reports he drinks alcohol occasionally and vapes. He denies tobacco use.   The patient presented to the ER 05/03/23 with shortness of breath. He reports he had COVID 2 weeks ago. He reports sudden onset shortness of breath. He denies chest pain, but does report some chest dullness more related to his breathing. EMS was called and gave him Duoneb. He denies any lower leg edema. He denies dizziness or passing out.   In the ER BP 122/90, HR 123bpm, RR 24, afebrile, 95% O2. Labs showed BNP 611, HS trop 118>169. WBC 9.8, Hgb 14.9.EKG showed ST with a heart rate of 116bpm. CXR showed no discrete infiltration, cardiomegaly, no PE. CTA chest showed no PE, cardiomegaly with left ventricular dilation and left atrial dilation, interstitial edema, trave b/l pleural effusions, possible CHF. He was given Solu-medrol, ASA, and IV heparin.   Past Medical History:  Diagnosis Date   H/O aortic valve replacement    PSVT (paroxysmal supraventricular tachycardia)     Past Surgical History:  Procedure Laterality Date   CARDIAC SURGERY     as a child     Home Medications:  Prior to Admission medications   Medication Sig Start Date End Date Taking? Authorizing Provider  ibuprofen (ADVIL) 800 MG tablet Take 1 tablet (800 mg  total) by mouth every 8 (eight) hours as needed for moderate pain. Patient not taking: Reported on 05/03/2023 09/11/22   Irean Hong, MD  oxyCODONE-acetaminophen (PERCOCET/ROXICET) 5-325 MG tablet Take 1 tablet by mouth every 4 (four) hours as needed for severe pain. Patient not taking: Reported on 05/03/2023 09/11/22   Irean Hong, MD    Inpatient Medications: Scheduled Meds:  [START ON 05/04/2023] aspirin EC  81 mg Oral Daily   atorvastatin  40 mg Oral Daily   furosemide  20 mg Intravenous Q12H   Continuous Infusions:  heparin 1,600 Units/hr (05/03/23 0507)   PRN Meds: acetaminophen, albuterol, dextromethorphan-guaiFENesin, diphenhydrAMINE  Allergies:   No Known Allergies  Social History:   Social History   Socioeconomic History   Marital status: Single    Spouse name: Not on file   Number of children: Not on file   Years of education: Not on file   Highest education level: Not on file  Occupational History   Not on file  Tobacco Use   Smoking status: Never   Smokeless tobacco: Not on file   Tobacco comments:    Vaping  Vaping Use   Vaping Use: Never used  Substance and Sexual Activity   Alcohol use: Yes   Drug use: Never   Sexual activity: Not on file  Other Topics Concern   Not on file  Social History Narrative   Not on file   Social Determinants of Health   Financial  Resource Strain: Not on file  Food Insecurity: Not on file  Transportation Needs: Not on file  Physical Activity: Not on file  Stress: Not on file  Social Connections: Not on file  Intimate Partner Violence: Not on file    Family History:   History reviewed. No pertinent family history.   ROS:  Please see the history of present illness.   All other ROS reviewed and negative.     Physical Exam/Data:   Vitals:   05/03/23 0600 05/03/23 0630 05/03/23 0800 05/03/23 0830  BP: 114/74 113/84 109/74 125/86  Pulse: (!) 104 98 100 (!) 103  Resp: 20 (!) 24 19 10   Temp:      TempSrc:       SpO2: 92% 96% 98% 100%  Weight:      Height:       No intake or output data in the 24 hours ending 05/03/23 0940    05/03/2023    4:36 AM 10/25/2022   11:22 PM 09/10/2022   11:28 PM  Last 3 Weights  Weight (lbs) 270 lb 8 oz 270 lb 15.1 oz 265 lb  Weight (kg) 122.698 kg 122.9 kg 120.203 kg     Body mass index is 30.47 kg/m.  General:  Well nourished, well developed, in no acute distress HEENT: normal Neck: no JVD Vascular: No carotid bruits; Distal pulses 2+ bilaterally Cardiac:  normal S1, S2; RRR; + murmur  Lungs:  clear to auscultation bilaterally, no wheezing, rhonchi or rales  Abd: soft, nontender, no hepatomegaly  Ext: no edema Musculoskeletal:  No deformities, BUE and BLE strength normal and equal Skin: warm and dry  Neuro:  CNs 2-12 intact, no focal abnormalities noted Psych:  Normal affect   EKG:  The EKG was personally reviewed and demonstrates:  pending Telemetry:  Telemetry was personally reviewed and demonstrates:  NSR/ST HR around 100bpm  Relevant CV Studies:  Echo ordered  Laboratory Data:  High Sensitivity Troponin:   Recent Labs  Lab 05/03/23 0356 05/03/23 0511  TROPONINIHS 118* 169*     Chemistry Recent Labs  Lab 05/03/23 0356  NA 139  K 3.7  CL 106  CO2 24  GLUCOSE 127*  BUN 14  CREATININE 0.80  CALCIUM 8.8*  GFRNONAA >60  ANIONGAP 9    Recent Labs  Lab 05/03/23 0356  PROT 7.2  ALBUMIN 4.0  AST 24  ALT 21  ALKPHOS 85  BILITOT 1.1   Lipids No results for input(s): "CHOL", "TRIG", "HDL", "LABVLDL", "LDLCALC", "CHOLHDL" in the last 168 hours.  Hematology Recent Labs  Lab 05/03/23 0356  WBC 9.8  RBC 4.95  HGB 14.9  HCT 44.7  MCV 90.3  MCH 30.1  MCHC 33.3  RDW 13.2  PLT 212   Thyroid No results for input(s): "TSH", "FREET4" in the last 168 hours.  BNP Recent Labs  Lab 05/03/23 0356  BNP 611.3*    DDimer No results for input(s): "DDIMER" in the last 168 hours.   Radiology/Studies:  CT Angio Chest PE W and/or  Wo Contrast  Result Date: 05/03/2023 CLINICAL DATA:  37 year old male with history of shortness of breath, chest pain and tachycardia. History of polysubstance abuse and vaping. EXAM: CT ANGIOGRAPHY CHEST WITH CONTRAST TECHNIQUE: Multidetector CT imaging of the chest was performed using the standard protocol during bolus administration of intravenous contrast. Multiplanar CT image reconstructions and MIPs were obtained to evaluate the vascular anatomy. RADIATION DOSE REDUCTION: This exam was performed according to the departmental dose-optimization program which  includes automated exposure control, adjustment of the mA and/or kV according to patient size and/or use of iterative reconstruction technique. CONTRAST:  OMNIPAQUE IOHEXOL 350 MG/ML SOLN COMPARISON:  No priors. FINDINGS: Cardiovascular: No filling defects in the pulmonary arterial tree to suggest pulmonary embolism. Heart size is mildly enlarged with mild left ventricular dilatation and left atrial dilatation. There is no significant pericardial fluid, thickening or pericardial calcification. No atherosclerotic calcifications are noted in the thoracic aorta or the coronary arteries. Status post median sternotomy for aortic valve replacement with what appears to be a stented bioprosthesis. The valve cusps appear heavily calcified. Ectasia of the ascending thoracic aorta (4.2 cm in diameter). Mediastinum/Nodes: Multiple prominent borderline enlarged mediastinal and hilar lymph nodes are noted (nonspecific), measuring up to 1.3 cm in short axis in the right paratracheal nodal station and right hilar region. Esophagus is unremarkable in appearance. No axillary lymphadenopathy. Lungs/Pleura: Widespread areas of ground-glass attenuation and interlobular septal thickening noted throughout both lungs, most severe throughout the mid to lower lungs. Trace bilateral pleural effusions lying dependently. No confluent consolidative airspace disease. No definite  suspicious appearing pulmonary nodules or masses are noted. Upper Abdomen: Unremarkable. Musculoskeletal: There are no aggressive appearing lytic or blastic lesions noted in the visualized portions of the skeleton. Median sternotomy wires. Review of the MIP images confirms the above findings. IMPRESSION: 1. No evidence of pulmonary embolism. 2. Cardiomegaly with left ventricular dilatation and left atrial dilatation. The appearance of the lungs is most suggestive of interstitial pulmonary edema. There also trace bilateral pleural effusions. Overall, findings are concerning for potential congestive heart failure. 3. Status post median sternotomy for aortic valve replacement with what appears to be a stented bioprosthesis. Notably, the bioprosthetic valve cusps superior densely calcified on today's examination. Further evaluation with echocardiography is recommended to exclude the possibility of valvular dysfunction. Electronically Signed   By: Trudie Reed M.D.   On: 05/03/2023 05:20   DG Chest 2 View  Result Date: 05/03/2023 CLINICAL DATA:  Shortness of breath EXAM: CHEST - 2 VIEW COMPARISON:  11/26/2014 FINDINGS: Cardiomegaly with mild perihilar/infrahilar edema. No pleural effusion or pneumothorax. Prosthetic valve. Postsurgical changes related to prior CABG. Median sternotomy. IMPRESSION: Cardiomegaly with mild perihilar/infrahilar edema. Electronically Signed   By: Charline Bills M.D.   On: 05/03/2023 03:57     Assessment and Plan:   DOE H/o Aortic valve replacement Elevated BNP - patient presents with sudden onset DOE given Duonebs and brought to the ER. S/p IV lasix. Breathing is better now.  - CXR showed mild perihilar edema - Chest CTA showed no PE, interstitial pulmonary edema, trace bilateral pleural effusions, concern for CHF - check echo - BNP 611 - IV lasix 20mg  BID - strict I/Os, daily weights and monitor kidney function with diuresis  Elevated troponin ?NSTEMI - HS  troponin mildly elevated. HS trop 118>169 - continue to trend  - patient reports breathing issues more related to shortness of breath - low suspicion of ACS given age - plan to check a Cardiac CTA to evaluate for ischemia  SVT - h/o SVT, does not appear patient was not any rate controlling medication PTA - SVT noted on telemetry  For questions or updates, please contact [facility] Please consult [email or web address] for contact info under    Signed, Ofelia Podolski David Stall, PA-C  05/03/2023 9:40 AM

## 2023-05-03 NOTE — Consult Note (Signed)
ANTICOAGULATION CONSULT NOTE   Pharmacy Consult for enoxaparin Indication: VTE prophylaxis  No Known Allergies  Patient Measurements: Height: 6\' 7"  (200.7 cm) Weight: 122.7 kg (270 lb 8 oz) IBW/kg (Calculated) : 93.7  Vital Signs: Temp: 97.9 F (36.6 C) (06/30 1145) Temp Source: Oral (06/30 1145) BP: 98/74 (06/30 1230) Pulse Rate: 84 (06/30 1230)  Labs: Recent Labs    05/03/23 0351 05/03/23 0356 05/03/23 0511 05/03/23 1124  HGB  --  14.9  --   --   HCT  --  44.7  --   --   PLT  --  212  --   --   APTT 29  --   --  38*  LABPROT 13.4  --   --   --   INR 1.0  --   --   --   HEPARINUNFRC  --   --   --  0.16*  CREATININE  --  0.80  --   --   TROPONINIHS  --  118* 169* 100*    Estimated Creatinine Clearance: 193.8 mL/min (by C-G formula based on SCr of 0.8 mg/dL).   Medical History: Past Medical History:  Diagnosis Date   H/O aortic valve replacement    PSVT (paroxysmal supraventricular tachycardia)     Medications:  (Not in a hospital admission)  Scheduled:   [START ON 05/04/2023] aspirin EC  81 mg Oral Daily   atorvastatin  40 mg Oral Daily   enoxaparin (LOVENOX) injection  60 mg Subcutaneous Daily   furosemide  20 mg Intravenous Q12H   [START ON 05/04/2023] ivabradine  15 mg Oral Once   [START ON 05/04/2023] metoprolol tartrate  100 mg Oral Once   Infusions:  PRN: acetaminophen, albuterol, dextromethorphan-guaiFENesin, diphenhydrAMINE Anti-infectives (From admission, onward)    None       Assessment: Pharmacy consulted to start enoxaparin for VTE ppx. Heparin infusion stopped.   Goal of Therapy:  Monitor platelets by anticoagulation protocol: Yes   Plan:  Lovenox 0.5 mg/kg daily   Ronnald Ramp, PharmD, BCPS 05/03/2023,12:45 PM

## 2023-05-03 NOTE — Plan of Care (Signed)
  Problem: Education: Goal: Ability to demonstrate management of disease process will improve Outcome: Progressing Goal: Ability to verbalize understanding of medication therapies will improve Outcome: Progressing Goal: Individualized Educational Video(s) Outcome: Progressing   Problem: Activity: Goal: Capacity to carry out activities will improve Outcome: Progressing   Problem: Cardiac: Goal: Ability to achieve and maintain adequate cardiopulmonary perfusion will improve Outcome: Progressing   Problem: Education: Goal: Knowledge of General Education information will improve Description: Including pain rating scale, medication(s)/side effects and non-pharmacologic comfort measures Outcome: Progressing   Problem: Health Behavior/Discharge Planning: Goal: Ability to manage health-related needs will improve Outcome: Progressing   Problem: Clinical Measurements: Goal: Ability to maintain clinical measurements within normal limits will improve Outcome: Progressing Goal: Will remain free from infection Outcome: Progressing Goal: Diagnostic test results will improve Outcome: Progressing Goal: Respiratory complications will improve Outcome: Progressing Goal: Cardiovascular complication will be avoided Outcome: Progressing   Problem: Activity: Goal: Risk for activity intolerance will decrease Outcome: Progressing   Problem: Nutrition: Goal: Adequate nutrition will be maintained Outcome: Progressing   Problem: Coping: Goal: Level of anxiety will decrease Outcome: Progressing   Problem: Elimination: Goal: Will not experience complications related to bowel motility Outcome: Progressing Goal: Will not experience complications related to urinary retention Outcome: Progressing   Problem: Pain Managment: Goal: General experience of comfort will improve Outcome: Progressing   Problem: Safety: Goal: Ability to remain free from injury will improve Outcome: Progressing    Problem: Skin Integrity: Goal: Risk for impaired skin integrity will decrease Outcome: Progressing   Problem: Education: Goal: Understanding of CV disease, CV risk reduction, and recovery process will improve Outcome: Progressing Goal: Individualized Educational Video(s) Outcome: Progressing   Problem: Activity: Goal: Ability to return to baseline activity level will improve Outcome: Progressing   Problem: Cardiovascular: Goal: Ability to achieve and maintain adequate cardiovascular perfusion will improve Outcome: Progressing Goal: Vascular access site(s) Level 0-1 will be maintained Outcome: Progressing   Problem: Health Behavior/Discharge Planning: Goal: Ability to safely manage health-related needs after discharge will improve Outcome: Progressing   

## 2023-05-03 NOTE — Progress Notes (Signed)
ANTICOAGULATION CONSULT NOTE  Pharmacy Consult for heparin infusion Indication: ACS/STEMI  No Known Allergies  Patient Measurements:   Heparin Dosing Weight: 119 kg  Vital Signs: Temp: 97.8 F (36.6 C) (06/30 0335) Temp Source: Oral (06/30 0335) BP: 122/90 (06/30 0322) Pulse Rate: 123 (06/30 0322)  Labs: Recent Labs    05/03/23 0356  HGB 14.9  HCT 44.7  PLT 212  CREATININE 0.80  TROPONINIHS 118*    CrCl cannot be calculated (Unknown ideal weight.).   Medical History: History reviewed. No pertinent past medical history.  Assessment: Pt is a 37 yo male presenting to ED d/t increase SOB, found with elevated Troponin I level.  Goal of Therapy:  Heparin level 0.3-0.7 units/ml Monitor platelets by anticoagulation protocol: Yes   Plan:  Bolus 4000 units x 1 Start heparin infusion at 1600 units/hr Will check HL in 6 hr after start of infusion CBC daily while on heparin  Otelia Sergeant, PharmD, [facility] 05/03/2023 4:35 AM

## 2023-05-03 NOTE — H&P (View-Only) (Signed)
 Cardiology Consultation   Patient ID: Richard Juarez MRN: [id number]; DOB: 03/20/1986  Admit date: 05/03/2023 Date of Consult: 05/03/2023  PCP:  Patient, No Pcp Per   [facility] Providers Cardiologist:  New  Patient Profile:   Richard Juarez is a 37 y.o. male with a hx of aortic valve replacement (congenital heart defect), pSVT, bipolar and ADHD who is being seen 05/03/2023 for the evaluation of chest pain and shortness of breath at the request of Dr. Niu.  History of Present Illness:   Mr. Vora reported history of a valve replacement in 2008 at [facility]. He has a congenital heart defect and has undergone 3 separate cardiac procedure, the last of which was 2008. He reports he drinks alcohol occasionally and vapes. He denies tobacco use.   The patient presented to the ER 05/03/23 with shortness of breath. He reports he had COVID 2 weeks ago. He reports sudden onset shortness of breath. He denies chest pain, but does report some chest dullness more related to his breathing. EMS was called and gave him Duoneb. He denies any lower leg edema. He denies dizziness or passing out.   In the ER BP 122/90, HR 123bpm, RR 24, afebrile, 95% O2. Labs showed BNP 611, HS trop 118>169. WBC 9.8, Hgb 14.9.EKG showed ST with a heart rate of 116bpm. CXR showed no discrete infiltration, cardiomegaly, no PE. CTA chest showed no PE, cardiomegaly with left ventricular dilation and left atrial dilation, interstitial edema, trave b/l pleural effusions, possible CHF. He was given Solu-medrol, ASA, and IV heparin.   Past Medical History:  Diagnosis Date   H/O aortic valve replacement    PSVT (paroxysmal supraventricular tachycardia)     Past Surgical History:  Procedure Laterality Date   CARDIAC SURGERY     as a child     Home Medications:  Prior to Admission medications   Medication Sig Start Date End Date Taking? Authorizing Provider  ibuprofen (ADVIL) 800 MG tablet Take 1 tablet (800 mg  total) by mouth every 8 (eight) hours as needed for moderate pain. Patient not taking: Reported on 05/03/2023 09/11/22   Sung, Jade J, MD  oxyCODONE-acetaminophen (PERCOCET/ROXICET) 5-325 MG tablet Take 1 tablet by mouth every 4 (four) hours as needed for severe pain. Patient not taking: Reported on 05/03/2023 09/11/22   Sung, Jade J, MD    Inpatient Medications: Scheduled Meds:  [START ON 05/04/2023] aspirin EC  81 mg Oral Daily   atorvastatin  40 mg Oral Daily   furosemide  20 mg Intravenous Q12H   Continuous Infusions:  heparin 1,600 Units/hr (05/03/23 0507)   PRN Meds: acetaminophen, albuterol, dextromethorphan-guaiFENesin, diphenhydrAMINE  Allergies:   No Known Allergies  Social History:   Social History   Socioeconomic History   Marital status: Single    Spouse name: Not on file   Number of children: Not on file   Years of education: Not on file   Highest education level: Not on file  Occupational History   Not on file  Tobacco Use   Smoking status: Never   Smokeless tobacco: Not on file   Tobacco comments:    Vaping  Vaping Use   Vaping Use: Never used  Substance and Sexual Activity   Alcohol use: Yes   Drug use: Never   Sexual activity: Not on file  Other Topics Concern   Not on file  Social History Narrative   Not on file   Social Determinants of Health   Financial   Resource Strain: Not on file  Food Insecurity: Not on file  Transportation Needs: Not on file  Physical Activity: Not on file  Stress: Not on file  Social Connections: Not on file  Intimate Partner Violence: Not on file    Family History:   History reviewed. No pertinent family history.   ROS:  Please see the history of present illness.   All other ROS reviewed and negative.     Physical Exam/Data:   Vitals:   05/03/23 0600 05/03/23 0630 05/03/23 0800 05/03/23 0830  BP: 114/74 113/84 109/74 125/86  Pulse: (!) 104 98 100 (!) 103  Resp: 20 (!) 24 19 10  Temp:      TempSrc:       SpO2: 92% 96% 98% 100%  Weight:      Height:       No intake or output data in the 24 hours ending 05/03/23 0940    05/03/2023    4:36 AM 10/25/2022   11:22 PM 09/10/2022   11:28 PM  Last 3 Weights  Weight (lbs) 270 lb 8 oz 270 lb 15.1 oz 265 lb  Weight (kg) 122.698 kg 122.9 kg 120.203 kg     Body mass index is 30.47 kg/m.  General:  Well nourished, well developed, in no acute distress HEENT: normal Neck: no JVD Vascular: No carotid bruits; Distal pulses 2+ bilaterally Cardiac:  normal S1, S2; RRR; + murmur  Lungs:  clear to auscultation bilaterally, no wheezing, rhonchi or rales  Abd: soft, nontender, no hepatomegaly  Ext: no edema Musculoskeletal:  No deformities, BUE and BLE strength normal and equal Skin: warm and dry  Neuro:  CNs 2-12 intact, no focal abnormalities noted Psych:  Normal affect   EKG:  The EKG was personally reviewed and demonstrates:  pending Telemetry:  Telemetry was personally reviewed and demonstrates:  NSR/ST HR around 100bpm  Relevant CV Studies:  Echo ordered  Laboratory Data:  High Sensitivity Troponin:   Recent Labs  Lab 05/03/23 0356 05/03/23 0511  TROPONINIHS 118* 169*     Chemistry Recent Labs  Lab 05/03/23 0356  NA 139  K 3.7  CL 106  CO2 24  GLUCOSE 127*  BUN 14  CREATININE 0.80  CALCIUM 8.8*  GFRNONAA >60  ANIONGAP 9    Recent Labs  Lab 05/03/23 0356  PROT 7.2  ALBUMIN 4.0  AST 24  ALT 21  ALKPHOS 85  BILITOT 1.1   Lipids No results for input(s): "CHOL", "TRIG", "HDL", "LABVLDL", "LDLCALC", "CHOLHDL" in the last 168 hours.  Hematology Recent Labs  Lab 05/03/23 0356  WBC 9.8  RBC 4.95  HGB 14.9  HCT 44.7  MCV 90.3  MCH 30.1  MCHC 33.3  RDW 13.2  PLT 212   Thyroid No results for input(s): "TSH", "FREET4" in the last 168 hours.  BNP Recent Labs  Lab 05/03/23 0356  BNP 611.3*    DDimer No results for input(s): "DDIMER" in the last 168 hours.   Radiology/Studies:  CT Angio Chest PE W and/or  Wo Contrast  Result Date: 05/03/2023 CLINICAL DATA:  37-year-old male with history of shortness of breath, chest pain and tachycardia. History of polysubstance abuse and vaping. EXAM: CT ANGIOGRAPHY CHEST WITH CONTRAST TECHNIQUE: Multidetector CT imaging of the chest was performed using the standard protocol during bolus administration of intravenous contrast. Multiplanar CT image reconstructions and MIPs were obtained to evaluate the vascular anatomy. RADIATION DOSE REDUCTION: This exam was performed according to the departmental dose-optimization program which   includes automated exposure control, adjustment of the mA and/or kV according to patient size and/or use of iterative reconstruction technique. CONTRAST:  100mL OMNIPAQUE IOHEXOL 350 MG/ML SOLN COMPARISON:  No priors. FINDINGS: Cardiovascular: No filling defects in the pulmonary arterial tree to suggest pulmonary embolism. Heart size is mildly enlarged with mild left ventricular dilatation and left atrial dilatation. There is no significant pericardial fluid, thickening or pericardial calcification. No atherosclerotic calcifications are noted in the thoracic aorta or the coronary arteries. Status post median sternotomy for aortic valve replacement with what appears to be a stented bioprosthesis. The valve cusps appear heavily calcified. Ectasia of the ascending thoracic aorta (4.2 cm in diameter). Mediastinum/Nodes: Multiple prominent borderline enlarged mediastinal and hilar lymph nodes are noted (nonspecific), measuring up to 1.3 cm in short axis in the right paratracheal nodal station and right hilar region. Esophagus is unremarkable in appearance. No axillary lymphadenopathy. Lungs/Pleura: Widespread areas of ground-glass attenuation and interlobular septal thickening noted throughout both lungs, most severe throughout the mid to lower lungs. Trace bilateral pleural effusions lying dependently. No confluent consolidative airspace disease. No definite  suspicious appearing pulmonary nodules or masses are noted. Upper Abdomen: Unremarkable. Musculoskeletal: There are no aggressive appearing lytic or blastic lesions noted in the visualized portions of the skeleton. Median sternotomy wires. Review of the MIP images confirms the above findings. IMPRESSION: 1. No evidence of pulmonary embolism. 2. Cardiomegaly with left ventricular dilatation and left atrial dilatation. The appearance of the lungs is most suggestive of interstitial pulmonary edema. There also trace bilateral pleural effusions. Overall, findings are concerning for potential congestive heart failure. 3. Status post median sternotomy for aortic valve replacement with what appears to be a stented bioprosthesis. Notably, the bioprosthetic valve cusps superior densely calcified on today's examination. Further evaluation with echocardiography is recommended to exclude the possibility of valvular dysfunction. Electronically Signed   By: Daniel  Entrikin M.D.   On: 05/03/2023 05:20   DG Chest 2 View  Result Date: 05/03/2023 CLINICAL DATA:  Shortness of breath EXAM: CHEST - 2 VIEW COMPARISON:  11/26/2014 FINDINGS: Cardiomegaly with mild perihilar/infrahilar edema. No pleural effusion or pneumothorax. Prosthetic valve. Postsurgical changes related to prior CABG. Median sternotomy. IMPRESSION: Cardiomegaly with mild perihilar/infrahilar edema. Electronically Signed   By: Sriyesh  Krishnan M.D.   On: 05/03/2023 03:57     Assessment and Plan:   DOE H/o Aortic valve replacement Elevated BNP - patient presents with sudden onset DOE given Duonebs and brought to the ER. S/p IV lasix. Breathing is better now.  - CXR showed mild perihilar edema - Chest CTA showed no PE, interstitial pulmonary edema, trace bilateral pleural effusions, concern for CHF - check echo - BNP 611 - IV lasix 20mg BID - strict I/Os, daily weights and monitor kidney function with diuresis  Elevated troponin ?NSTEMI - HS  troponin mildly elevated. HS trop 118>169 - continue to trend  - patient reports breathing issues more related to shortness of breath - low suspicion of ACS given age - plan to check a Cardiac CTA to evaluate for ischemia  SVT - h/o SVT, does not appear patient was not any rate controlling medication PTA - SVT noted on telemetry  For questions or updates, please contact [facility] Please consult [email or web address] for contact info under    Signed, Kenyah Luba H Surya Folden, PA-C  05/03/2023 9:40 AM  

## 2023-05-03 NOTE — Progress Notes (Signed)
ANTICOAGULATION CONSULT NOTE  Pharmacy Consult for heparin  Indication: ACS/STEMI  No Known Allergies  Patient Measurements: Height: 6\' 7"  (200.7 cm) Weight: 122.7 kg (270 lb 8 oz) IBW/kg (Calculated) : 93.7 Heparin Dosing Weight: 119 kg  Vital Signs: Temp: 97.9 F (36.6 C) (06/30 1145) Temp Source: Oral (06/30 1145) BP: 104/74 (06/30 1144) Pulse Rate: 99 (06/30 1144)  Labs: Recent Labs    05/03/23 0351 05/03/23 0356 05/03/23 0511 05/03/23 1124  HGB  --  14.9  --   --   HCT  --  44.7  --   --   PLT  --  212  --   --   APTT 29  --   --  38*  LABPROT 13.4  --   --   --   INR 1.0  --   --   --   HEPARINUNFRC  --   --   --  0.16*  CREATININE  --  0.80  --   --   TROPONINIHS  --  118* 169*  --      Estimated Creatinine Clearance: 193.8 mL/min (by C-G formula based on SCr of 0.8 mg/dL).   Medical History: Past Medical History:  Diagnosis Date   H/O aortic valve replacement    PSVT (paroxysmal supraventricular tachycardia)     Assessment: Pt is a 37 yo male presenting to ED d/t increase SOB, found with elevated Troponin I level.  6/30 1124 HL 0.16.   Goal of Therapy:  Heparin level 0.3-0.7 units/ml Monitor platelets by anticoagulation protocol: Yes   Plan:  Heparin level is subtherapeutic. Will give heparin bolus of 3500 units x 1 and increase heparin infusion to 1900 units/hr. Recheck heparin level in 6 hours. CBC daily while on heparin.   Paschal Dopp, PharmD, 05/03/2023 11:59 AM

## 2023-05-03 NOTE — Progress Notes (Signed)
*  PRELIMINARY RESULTS* Echocardiogram 2D Echocardiogram has been performed.  Laddie Aquas 05/03/2023, 1:53 PM

## 2023-05-03 NOTE — ED Notes (Signed)
Dr. Katrinka Blazing was informed of critical Troponin of 118.

## 2023-05-03 NOTE — H&P (Signed)
History and Physical    Richard Juarez:[id number] DOB: Mar 07, 1986 DOA: 05/03/2023  Referring MD/NP/PA:   PCP: Patient, No Pcp Per   Patient coming from:  The patient is coming from home.     Chief Complaint: SOB  HPI: Richard Juarez is a 37 y.o. 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  Patient states that he had " heart hole repair" at age 3, and aortic valve repair at the age 10, aortic valve replacement at the age 59.  He reports upper respiratory infection 2 weeks ago.  He developed shortness of breath yesterday, which has been progressively worsening.  Patient has cough with little mucus production.  No chest pain, fever or chills.  Patient does not have nausea, vomiting, diarrhea or abdominal pain.  No symptoms of UTI.  Denies recent fall or rectal bleeding.  No dark stool.  He states that he used to take beta-blocker for PSVT, currently not taking his medications.  He states that he is vaping, but he does not think he needs nicotine patch.   Data reviewed independently and ED Course: pt was found to have BNP 611, trop  118 --> 159, negative COVID PCR, GFR> 60, WBC 9.8, temperature normal, blood pressure 125/86, heart rate of 103, RR 24, oxygen saturation 92% on room air, which improved to 100% on 2 L oxygen.  Chest x-ray showed cardiomegaly and mild pulmonary edema.  CTA negative for PE, but showed cardiomegaly, left ventricular dilation and interstitial pulm edema.  Patient is admitted to PCU as inpatient.  Dr. Azucena Cecil of cardiology is consulted.   EKG: I have personally reviewed.  Sinus rhythm, QTc 515, bifascicular block, bilateral atrial enlargement, early progression   Review of Systems:   General: no fevers, chills, no body weight gain, fatigue HEENT: no blurry vision, hearing changes or sore throat Respiratory: has  dyspnea, coughing, no wheezing CV: no chest pain, no palpitations GI: no nausea, vomiting, abdominal pain, diarrhea, constipation GU: no dysuria, burning on urination, increased urinary frequency, hematuria  Ext: has leg edema Neuro: no unilateral weakness, numbness, or tingling, no vision change or hearing loss Skin: no rash, no skin tear. MSK: No muscle spasm, no deformity, no limitation of range of movement in spin Heme: No easy bruising.  Travel history: No recent long distant travel.   Allergy: No Known Allergies  Past Medical History:  Diagnosis Date   H/O aortic valve replacement    PSVT (paroxysmal supraventricular tachycardia)     Past Surgical History:  Procedure Laterality Date   AORTIC VALVE REPLACEMENT     Bovine Valve   CARDIAC SURGERY     pt reports "heart hole" repair at age of 45    Social History:  reports that he has never smoked. He does not have any smokeless tobacco history on file. He reports current alcohol use. He reports that he does not use drugs.   Pt reports vaping.   Family History:  Family History  Problem Relation Age of Onset   Coronary artery disease Father      Prior to Admission medications   Medication Sig Start Date End Date Taking? Authorizing Provider  ibuprofen (ADVIL) 800 MG tablet Take 1 tablet (800 mg total) by mouth every 8 (eight) hours as needed for moderate pain. Patient not taking: Reported on 05/03/2023 09/11/22   Irean Hong, MD  oxyCODONE-acetaminophen (  PERCOCET/ROXICET) 5-325 MG tablet Take 1 tablet by mouth every 4 (four) hours as needed for severe pain. Patient not taking: Reported on 05/03/2023 09/11/22   Irean Hong, MD    Physical Exam: Vitals:   05/03/23 0800 05/03/23 0830 05/03/23 1144 05/03/23 1145  BP: 109/74 125/86 104/74   Pulse: 100 (!) 103 99   Resp: 19 10    Temp:    97.9 F (36.6 C)  TempSrc:    Oral  SpO2: 98% 100%    Weight:      Height:       General: Not in acute distress HEENT:       Eyes:  PERRL, EOMI, no jaundice       ENT: No discharge from the ears and nose, no pharynx injection, no tonsillar enlargement.        Neck: No JVD, no bruit, no mass felt. Heme: No neck lymph node enlargement. Cardiac: S1/S2, RRR, 3/6 systolic murmurs, No gallops or rubs. Respiratory: No rales, wheezing, rhonchi or rubs. GI: Soft, nondistended, nontender, no rebound pain, no organomegaly, BS present. GU: No hematuria Ext: 1+ pitting leg edema bilaterally. 1+DP/PT pulse bilaterally. Musculoskeletal: No joint deformities, No joint redness or warmth, no limitation of ROM in spin. Skin: No rashes.  Neuro: Alert, oriented X3, cranial nerves II-XII grossly intact, moves all extremities normally. Psych: Patient is not psychotic, no suicidal or hemocidal ideation.  Labs on Admission: I have personally reviewed following labs and imaging studies  CBC: Recent Labs  Lab 05/03/23 0356  WBC 9.8  NEUTROABS 7.6  HGB 14.9  HCT 44.7  MCV 90.3  PLT 212   Basic Metabolic Panel: Recent Labs  Lab 05/03/23 0356 05/03/23 0511  NA 139  --   K 3.7  --   CL 106  --   CO2 24  --   GLUCOSE 127*  --   BUN 14  --   CREATININE 0.80  --   CALCIUM 8.8*  --   MG  --  1.8   GFR: Estimated Creatinine Clearance: 193.8 mL/min (by C-G formula based on SCr of 0.8 mg/dL). Liver Function Tests: Recent Labs  Lab 05/03/23 0356  AST 24  ALT 21  ALKPHOS 85  BILITOT 1.1  PROT 7.2  ALBUMIN 4.0   No results for input(s): "LIPASE", "AMYLASE" in the last 168 hours. No results for input(s): "AMMONIA" in the last 168 hours. Coagulation Profile: Recent Labs  Lab 05/03/23 0351  INR 1.0   Cardiac Enzymes: No results for input(s): "CKTOTAL", "CKMB", "CKMBINDEX", "TROPONINI" in the last 168 hours. BNP (last 3 results) No results for input(s): "PROBNP" in the last 8760 hours. HbA1C: No results for input(s): "HGBA1C" in the last 72 hours. CBG: No results for input(s): "GLUCAP" in the last 168 hours. Lipid  Profile: No results for input(s): "CHOL", "HDL", "LDLCALC", "TRIG", "CHOLHDL", "LDLDIRECT" in the last 72 hours. Thyroid Function Tests: No results for input(s): "TSH", "T4TOTAL", "FREET4", "T3FREE", "THYROIDAB" in the last 72 hours. Anemia Panel: No results for input(s): "VITAMINB12", "FOLATE", "FERRITIN", "TIBC", "IRON", "RETICCTPCT" in the last 72 hours. Urine analysis: No results found for: "COLORURINE", "APPEARANCEUR", "LABSPEC", "PHURINE", "GLUCOSEU", "HGBUR", "BILIRUBINUR", "KETONESUR", "PROTEINUR", "UROBILINOGEN", "NITRITE", "LEUKOCYTESUR" Sepsis Labs: @LABRCNTIP (procalcitonin:4,lacticidven:4) ) Recent Results (from the past 240 hour(s))  SARS Coronavirus 2 by RT PCR (hospital order, performed in [facility] hospital lab) *cepheid single result test* Anterior Nasal Swab     Status: None   Collection Time: 05/03/23  5:11 AM   Specimen: Anterior  Nasal Swab  Result Value Ref Range Status   SARS Coronavirus 2 by RT PCR NEGATIVE NEGATIVE Final    Comment: (NOTE) SARS-CoV-2 target nucleic acids are NOT DETECTED.  The SARS-CoV-2 RNA is generally detectable in upper and lower respiratory specimens during the acute phase of infection. The lowest concentration of SARS-CoV-2 viral copies this assay can detect is 250 copies / mL. A negative result does not preclude SARS-CoV-2 infection and should not be used as the sole basis for treatment or other patient management decisions.  A negative result may occur with improper specimen collection / handling, submission of specimen other than nasopharyngeal swab, presence of viral mutation(s) within the areas targeted by this assay, and inadequate number of viral copies (<250 copies / mL). A negative result must be combined with clinical observations, patient history, and epidemiological information.  Fact Sheet for Patients:   RoadLapTop.co.za  Fact Sheet for Healthcare  Providers: http://kim-miller.com/  This test is not yet approved or  cleared by the [place] FDA and has been authorized for detection and/or diagnosis of SARS-CoV-2 by FDA under an Emergency Use Authorization (EUA).  This EUA will remain in effect (meaning this test can be used) for the duration of the COVID-19 declaration under Section 564(b)(1) of the Act, 21 U.S.C. section 360bbb-3(b)(1), unless the authorization is terminated or revoked sooner.  Performed at [facility], [address] [place], [place] [address]      Radiological Exams on Admission: CT Angio Chest PE W and/or Wo Contrast  Result Date: 05/03/2023 CLINICAL DATA:  37 year old male with history of shortness of breath, chest pain and tachycardia. History of polysubstance abuse and vaping. EXAM: CT ANGIOGRAPHY CHEST WITH CONTRAST TECHNIQUE: Multidetector CT imaging of the chest was performed using the standard protocol during bolus administration of intravenous contrast. Multiplanar CT image reconstructions and MIPs were obtained to evaluate the vascular anatomy. RADIATION DOSE REDUCTION: This exam was performed according to the departmental dose-optimization program which includes automated exposure control, adjustment of the mA and/or kV according to patient size and/or use of iterative reconstruction technique. CONTRAST:  OMNIPAQUE IOHEXOL 350 MG/ML SOLN COMPARISON:  No priors. FINDINGS: Cardiovascular: No filling defects in the pulmonary arterial tree to suggest pulmonary embolism. Heart size is mildly enlarged with mild left ventricular dilatation and left atrial dilatation. There is no significant pericardial fluid, thickening or pericardial calcification. No atherosclerotic calcifications are noted in the thoracic aorta or the coronary arteries. Status post median sternotomy for aortic valve replacement with what appears to be a stented bioprosthesis. The valve cusps appear heavily  calcified. Ectasia of the ascending thoracic aorta (4.2 cm in diameter). Mediastinum/Nodes: Multiple prominent borderline enlarged mediastinal and hilar lymph nodes are noted (nonspecific), measuring up to 1.3 cm in short axis in the right paratracheal nodal station and right hilar region. Esophagus is unremarkable in appearance. No axillary lymphadenopathy. Lungs/Pleura: Widespread areas of ground-glass attenuation and interlobular septal thickening noted throughout both lungs, most severe throughout the mid to lower lungs. Trace bilateral pleural effusions lying dependently. No confluent consolidative airspace disease. No definite suspicious appearing pulmonary nodules or masses are noted. Upper Abdomen: Unremarkable. Musculoskeletal: There are no aggressive appearing lytic or blastic lesions noted in the visualized portions of the skeleton. Median sternotomy wires. Review of the MIP images confirms the above findings. IMPRESSION: 1. No evidence of pulmonary embolism. 2. Cardiomegaly with left ventricular dilatation and left atrial dilatation. The appearance of the lungs is most suggestive of interstitial pulmonary edema. There also  trace bilateral pleural effusions. Overall, findings are concerning for potential congestive heart failure. 3. Status post median sternotomy for aortic valve replacement with what appears to be a stented bioprosthesis. Notably, the bioprosthetic valve cusps superior densely calcified on today's examination. Further evaluation with echocardiography is recommended to exclude the possibility of valvular dysfunction. Electronically Signed   By: Trudie Reed M.D.   On: 05/03/2023 05:20   DG Chest 2 View  Result Date: 05/03/2023 CLINICAL DATA:  Shortness of breath EXAM: CHEST - 2 VIEW COMPARISON:  11/26/2014 FINDINGS: Cardiomegaly with mild perihilar/infrahilar edema. No pleural effusion or pneumothorax. Prosthetic valve. Postsurgical changes related to prior CABG. Median sternotomy.  IMPRESSION: Cardiomegaly with mild perihilar/infrahilar edema. Electronically Signed   By: Charline Bills M.D.   On: 05/03/2023 03:57      Assessment/Plan Principal Problem:   Acute CHF (congestive heart failure) (HCC) Active Problems:   NSTEMI (non-ST elevated myocardial infarction) (HCC)   PSVT (paroxysmal supraventricular tachycardia)   Hypokalemia   Assessment and Plan:  Acute CHF (congestive heart failure) (HCC): pt has shortness of breath, leg edema, elevated BNP 611, pulmonary edema by chest x-ray and CTA.  CTA negative for PE.  Clinically consistent with acute CHF.  CTA also showed left ventricular dilation, indicating possible acute systolic CHF.  Consulted Dr. Azucena Cecil of cardiology  -Will admit to PCU as inpatient -Lasix 20 mg bid by IV -2d echo -Daily weights -strict I/O's -Low salt diet -Fluid restriction -As needed bronchodilators for shortness of breath  NSTEMI (non-ST elevated myocardial infarction) [facility]): trop  118 --> 169 -IV heparin started in ED. -start ASA, lipitor -check A1c and FLP -check UDS -trend trop -f/u 2d echo -f/u Cardiac CT to evaluate for ischemia per card  PSVT (paroxysmal supraventricular tachycardia): HR 103 -tele monitoring -will not start nodal blockers due to possible acute systolic CHF.  Hypokalemia: Potassium 3.7 -Repleted potassium -Check magnesium level --> 1.8     DVT ppx: on IV Heparin    Code Status: Full code   Family Communication: not done, no family member is at bed side.    Disposition Plan:  Anticipate discharge back to previous environment  Consults called:  Dr. Azucena Cecil of cardiology is consulted.  Admission status and Level of care: Progressive:   as inpt      Dispo: The patient is from: Home              Anticipated d/c is to: Home              Anticipated d/c date is: 2 days              Patient currently is not medically stable to d/c.    Severity of Illness:  The appropriate patient  status for this patient is INPATIENT. Inpatient status is judged to be reasonable and necessary in order to provide the required intensity of service to ensure the patient's safety. The patient's presenting symptoms, physical exam findings, and initial radiographic and laboratory data in the context of their chronic comorbidities is felt to place them at high risk for further clinical deterioration. Furthermore, it is not anticipated that the patient will be medically stable for discharge from the hospital within 2 midnights of admission.   * I certify that at the point of admission it is my clinical judgment that the patient will require inpatient hospital care spanning beyond 2 midnights from the point of admission due to high intensity of service, high risk for further deterioration and  high frequency of surveillance required.*       Date of Service 05/03/2023    Lorretta Harp [facility]   If 7PM-7AM, please contact night-coverage [email or web address] 05/03/2023, 12:08 PM

## 2023-05-03 NOTE — ED Provider Notes (Signed)
[facility] Provider Note    Event Date/Time   First MD Initiated Contact with Patient 05/03/23 0320     (approximate)   History   Shortness of Breath (Pt called ems d/t increased sob. Pt was 92% on RAS at the house. EMS gave Douneb and albuterol tx. Sats was 97% on ra after meds. )   HPI  Richard Juarez is a 37 y.o. male who presents to the ED for evaluation of Shortness of Breath (Pt called ems d/t increased sob. Pt was 92% on RAS at the house. EMS gave Douneb and albuterol tx. Sats was 97% on ra after meds. )   Patient presents for to the ED from home via EMS for evaluation of dyspnea and chest pressure.  Reports he had a "cold" a couple weeks ago that he seemed to get over but developed dyspnea, dizziness and pressure in his lungs tonight when laying down for sleep.  Also reports regularly vaping.   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  He used to see a cardiologist at [facility], as recently as 1 or 2 years ago.  Used to be on beta-blockers for paroxysmal SVT.  Takes no regular prescription medications now   Physical Exam   Triage Vital Signs: ED Triage Vitals  Enc Vitals Group     BP 05/03/23 0322 (!) 122/90     Pulse Rate 05/03/23 0322 (!) 123     Resp 05/03/23 0322 (!) 24     Temp 05/03/23 0335 97.8 F (36.6 C)     Temp Source 05/03/23 0335 Oral     SpO2 05/03/23 0322 95 %     Weight 05/03/23 0436 270 lb 8 oz (122.7 kg)     Height 05/03/23 0437 6\' 7"  (2.007 m)     Head Circumference --      Peak Flow --      Pain Score 05/03/23 0331 0     Pain Loc --      Pain Edu? --      Excl. in GC? --     Most recent vital signs: Vitals:   05/03/23 0600 05/03/23 0630  BP: 114/74 113/84  Pulse: (!) 104 98  Resp: 20 (!) 24  Temp:    SpO2: 92% 96%    General: Awake, no distress.  CV:  Good peripheral perfusion.  Resp:  Mild tachypnea to  the low 20s, no distress, poor airflow throughout and scattered expiratory wheezes. Abd:  No distention.  MSK:  No deformity noted.  Neuro:  No focal deficits appreciated. Other:     ED Results / Procedures / Treatments   Labs (all labs ordered are listed, but only abnormal results are displayed) Labs Reviewed  COMPREHENSIVE METABOLIC PANEL - Abnormal; Notable for the following components:      Result Value   Glucose, Bld 127 (*)    Calcium 8.8 (*)    All other components within normal limits  BRAIN NATRIURETIC PEPTIDE - Abnormal; Notable for the following components:   B Natriuretic Peptide 611.3 (*)    All other components within normal limits  TROPONIN I (HIGH SENSITIVITY) - Abnormal; Notable for the following components:   Troponin I (High Sensitivity) 118 (*)    All other components within normal limits  TROPONIN I (HIGH SENSITIVITY) - Abnormal; Notable for the following components:   Troponin I (High Sensitivity) 169 (*)  All other components within normal limits  SARS CORONAVIRUS 2 BY RT PCR  CBC WITH DIFFERENTIAL/PLATELET  PROTIME-INR  APTT  HEPARIN LEVEL (UNFRACTIONATED)  APTT    EKG Sinus tachycardia with a rate of 116 bpm.  Normal axis.  Incomplete right bundle.  Nonspecific ST changes without STEMI.  RADIOLOGY 2 view CXR interpreted by me without discrete infiltration, cardiomegaly with pulmonary vascular congestion is noted CTA chest without PE  Official radiology report(s): CT Angio Chest PE W and/or Wo Contrast  Result Date: 05/03/2023 CLINICAL DATA:  37 year old male with history of shortness of breath, chest pain and tachycardia. History of polysubstance abuse and vaping. EXAM: CT ANGIOGRAPHY CHEST WITH CONTRAST TECHNIQUE: Multidetector CT imaging of the chest was performed using the standard protocol during bolus administration of intravenous contrast. Multiplanar CT image reconstructions and MIPs were obtained to evaluate the vascular anatomy.  RADIATION DOSE REDUCTION: This exam was performed according to the departmental dose-optimization program which includes automated exposure control, adjustment of the mA and/or kV according to patient size and/or use of iterative reconstruction technique. CONTRAST:  OMNIPAQUE IOHEXOL 350 MG/ML SOLN COMPARISON:  No priors. FINDINGS: Cardiovascular: No filling defects in the pulmonary arterial tree to suggest pulmonary embolism. Heart size is mildly enlarged with mild left ventricular dilatation and left atrial dilatation. There is no significant pericardial fluid, thickening or pericardial calcification. No atherosclerotic calcifications are noted in the thoracic aorta or the coronary arteries. Status post median sternotomy for aortic valve replacement with what appears to be a stented bioprosthesis. The valve cusps appear heavily calcified. Ectasia of the ascending thoracic aorta (4.2 cm in diameter). Mediastinum/Nodes: Multiple prominent borderline enlarged mediastinal and hilar lymph nodes are noted (nonspecific), measuring up to 1.3 cm in short axis in the right paratracheal nodal station and right hilar region. Esophagus is unremarkable in appearance. No axillary lymphadenopathy. Lungs/Pleura: Widespread areas of ground-glass attenuation and interlobular septal thickening noted throughout both lungs, most severe throughout the mid to lower lungs. Trace bilateral pleural effusions lying dependently. No confluent consolidative airspace disease. No definite suspicious appearing pulmonary nodules or masses are noted. Upper Abdomen: Unremarkable. Musculoskeletal: There are no aggressive appearing lytic or blastic lesions noted in the visualized portions of the skeleton. Median sternotomy wires. Review of the MIP images confirms the above findings. IMPRESSION: 1. No evidence of pulmonary embolism. 2. Cardiomegaly with left ventricular dilatation and left atrial dilatation. The appearance of the lungs is most  suggestive of interstitial pulmonary edema. There also trace bilateral pleural effusions. Overall, findings are concerning for potential congestive heart failure. 3. Status post median sternotomy for aortic valve replacement with what appears to be a stented bioprosthesis. Notably, the bioprosthetic valve cusps superior densely calcified on today's examination. Further evaluation with echocardiography is recommended to exclude the possibility of valvular dysfunction. Electronically Signed   By: Trudie Reed M.D.   On: 05/03/2023 05:20   DG Chest 2 View  Result Date: 05/03/2023 CLINICAL DATA:  Shortness of breath EXAM: CHEST - 2 VIEW COMPARISON:  11/26/2014 FINDINGS: Cardiomegaly with mild perihilar/infrahilar edema. No pleural effusion or pneumothorax. Prosthetic valve. Postsurgical changes related to prior CABG. Median sternotomy. IMPRESSION: Cardiomegaly with mild perihilar/infrahilar edema. Electronically Signed   By: Charline Bills M.D.   On: 05/03/2023 03:57    PROCEDURES and INTERVENTIONS:  .1-3 Lead EKG Interpretation  Performed by: Delton Prairie, MD Authorized by: Delton Prairie, MD     Interpretation: abnormal     ECG rate:  110   ECG rate assessment:  tachycardic     Rhythm: sinus tachycardia     Ectopy: none     Conduction: normal   .Critical Care  Performed by: Delton Prairie, MD Authorized by: Delton Prairie, MD   Critical care provider statement:    Critical care time (minutes):  30   Critical care time was exclusive of:  Separately billable procedures and treating other patients   Critical care was necessary to treat or prevent imminent or life-threatening deterioration of the following conditions:  Cardiac failure and circulatory failure   Critical care was time spent personally by me on the following activities:  Development of treatment plan with patient or surrogate, discussions with consultants, evaluation of patient's response to treatment, examination of patient,  ordering and review of laboratory studies, ordering and review of radiographic studies, ordering and performing treatments and interventions, pulse oximetry, re-evaluation of patient's condition and review of old charts   Medications  heparin ADULT infusion 100 units/mL (25000 units/232mL) (1,600 Units/hr Intravenous New Bag/Given 05/03/23 0507)  ipratropium-albuterol (DUONEB) 0.5-2.5 (3) MG/3ML nebulizer solution 6 mL (6 mLs Nebulization Given 05/03/23 0410)  methylPREDNISolone sodium succinate (SOLU-MEDROL) 125 mg/2 mL injection 125 mg (125 mg Intravenous Given 05/03/23 0410)  iohexol (OMNIPAQUE) 350 MG/ML injection 100 mL (100 mLs Intravenous Contrast Given 05/03/23 0438)  aspirin chewable tablet 324 mg (324 mg Oral Given 05/03/23 0434)  heparin bolus via infusion 4,000 Units (4,000 Units Intravenous Bolus from Bag 05/03/23 0508)     IMPRESSION / MDM / ASSESSMENT AND PLAN / ED COURSE  I reviewed the triage vital signs and the nursing notes.  Differential diagnosis includes, but is not limited to, ACS, PTX, PNA, muscle strain/spasm, PE, dissection, anxiety, pleural effusion, myocarditis, pericarditis  {Patient presents with symptoms of an acute illness or injury that is potentially life-threatening.  Patient presents with acute dyspnea and chest discomfort concerning for myocarditis and possible NSTEMI.  EKG with nonspecific changes but no STEMI.  Troponins are elevated and rising.  BNP is elevated and imaging with congestion concerning for new onset CHF.  Normal CBC and CMP.  CTA without PE and redemonstrates pulmonary edema.  Considering his preceding viral syndrome and minimal medical history, myocarditis is a concern.  He received Solu-Medrol, aspirin and heparin in the ED.  Consult with medicine for admission.  Clinical Course as of 05/03/23 1610  Wynelle Link May 03, 2023  0556 Reassessed and discussed plan of care [DS]    Clinical Course User Index [DS] Delton Prairie, MD     FINAL CLINICAL  IMPRESSION(S) / ED DIAGNOSES   Final diagnoses:  NSTEMI (non-ST elevated myocardial infarction) [facility])  Other acute myocarditis     Rx / DC Orders   ED Discharge Orders     None        Note:  This document was prepared using Dragon voice recognition software and may include unintentional dictation errors.   Delton Prairie, MD 05/03/23 [phone]

## 2023-05-03 NOTE — ED Triage Notes (Signed)
Pt called ems d/t increased sob. Pt was 92% on RAS at the house. EMS gave Douneb and albuterol tx. Sats was 97% on ra after meds.

## 2023-05-04 ENCOUNTER — Encounter
Admission: EM | Disposition: A | Discharge status: Other Acute Inpatient Hospital | Payer: Self-pay | Source: Home / Self Care | Attending: Internal Medicine

## 2023-05-04 DIAGNOSIS — E663 Overweight: Secondary | ICD-10-CM | POA: Insufficient documentation

## 2023-05-04 DIAGNOSIS — I350 Nonrheumatic aortic (valve) stenosis: Secondary | ICD-10-CM

## 2023-05-04 DIAGNOSIS — I5033 Acute on chronic diastolic (congestive) heart failure: Secondary | ICD-10-CM

## 2023-05-04 HISTORY — PX: RIGHT/LEFT HEART CATH AND CORONARY ANGIOGRAPHY: CATH118266

## 2023-05-04 LAB — POCT I-STAT 7, (LYTES, BLD GAS, ICA,H+H)
Acid-Base Excess: 3 mmol/L — ABNORMAL HIGH (ref 0.0–2.0)
Bicarbonate: 26.6 mmol/L (ref 20.0–28.0)
Calcium, Ion: 1.13 mmol/L — ABNORMAL LOW (ref 1.15–1.40)
HCT: 43 % (ref 39.0–52.0)
Hemoglobin: 14.6 g/dL (ref 13.0–17.0)
O2 Saturation: 96 %
Potassium: 3.6 mmol/L (ref 3.5–5.1)
Sodium: 141 mmol/L (ref 135–145)
TCO2: 28 mmol/L (ref 22–32)
pCO2 arterial: 36.9 mmHg (ref 32–48)
pH, Arterial: 7.465 — ABNORMAL HIGH (ref 7.35–7.45)
pO2, Arterial: 75 mmHg — ABNORMAL LOW (ref 83–108)

## 2023-05-04 LAB — LIPID PANEL
Cholesterol: 145 mg/dL (ref 0–200)
HDL: 51 mg/dL (ref >40)
LDL Cholesterol: 83 mg/dL (ref 0–99)
Total CHOL/HDL Ratio: 2.8 RATIO
Triglycerides: 57 mg/dL (ref <150)
VLDL: 11 mg/dL (ref 0–40)

## 2023-05-04 LAB — BASIC METABOLIC PANEL
Anion gap: 9 (ref 5–15)
BUN: 14 mg/dL (ref 6–20)
CO2: 26 mmol/L (ref 22–32)
Calcium: 9 mg/dL (ref 8.9–10.3)
Chloride: 104 mmol/L (ref 98–111)
Creatinine, Ser: 0.89 mg/dL (ref 0.61–1.24)
GFR, Estimated: >60 mL/min (ref >60)
Glucose, Bld: 89 mg/dL (ref 70–99)
Potassium: 4.5 mmol/L (ref 3.5–5.1)
Sodium: 139 mmol/L (ref 135–145)

## 2023-05-04 LAB — CBC
HCT: 43.3 % (ref 39.0–52.0)
Hemoglobin: 14.3 g/dL (ref 13.0–17.0)
MCH: 30 pg (ref 26.0–34.0)
MCHC: 33 g/dL (ref 30.0–36.0)
MCV: 90.8 fL (ref 80.0–100.0)
Platelets: 228 10*3/uL (ref 150–400)
RBC: 4.77 MIL/uL (ref 4.22–5.81)
RDW: 13.2 % (ref 11.5–15.5)
WBC: 13.2 10*3/uL — ABNORMAL HIGH (ref 4.0–10.5)
nRBC: 0 % (ref 0.0–0.2)

## 2023-05-04 LAB — HIV ANTIBODY (ROUTINE TESTING W REFLEX): HIV Screen 4th Generation wRfx: NONREACTIVE

## 2023-05-04 LAB — MAGNESIUM: Magnesium: 2.2 mg/dL (ref 1.7–2.4)

## 2023-05-04 SURGERY — RIGHT/LEFT HEART CATH AND CORONARY ANGIOGRAPHY
Anesthesia: Moderate Sedation

## 2023-05-04 MED ORDER — SODIUM CHLORIDE 0.9% FLUSH: 3.0000 mL | INTRAVENOUS | Status: DC | PRN

## 2023-05-04 MED ORDER — SODIUM CHLORIDE 0.9 % IV BOLUS
INTRAVENOUS | Status: DC | PRN
  Administered 2023-05-04: 250 mL via INTRAVENOUS

## 2023-05-04 MED ORDER — HEPARIN (PORCINE) IN NACL 1000-0.9 UT/500ML-% IV SOLN
INTRAVENOUS | Status: AC
  Filled 2023-05-04: qty 1000

## 2023-05-04 MED ORDER — SODIUM CHLORIDE 0.9% FLUSH
3.0000 mL | Freq: Two times a day (BID) | INTRAVENOUS | Status: DC
  Administered 2023-05-04 – 2023-05-05 (×2): 3 mL via INTRAVENOUS

## 2023-05-04 MED ORDER — SODIUM CHLORIDE 0.9 % IV SOLN: 250.0000 mL | INTRAVENOUS | Status: DC | PRN

## 2023-05-04 MED ORDER — MIDAZOLAM HCL 2 MG/2ML IJ SOLN
INTRAMUSCULAR | Status: DC | PRN
  Administered 2023-05-04: 1 mg via INTRAVENOUS

## 2023-05-04 MED ORDER — IOHEXOL 300 MG/ML  SOLN
INTRAMUSCULAR | Status: DC | PRN
  Administered 2023-05-04: 42 mL

## 2023-05-04 MED ORDER — HEPARIN SODIUM (PORCINE) 1000 UNIT/ML IJ SOLN
INTRAMUSCULAR | Status: DC | PRN
  Administered 2023-05-04: 6000 [IU] via INTRAVENOUS

## 2023-05-04 MED ORDER — HEPARIN SODIUM (PORCINE) 1000 UNIT/ML IJ SOLN
INTRAMUSCULAR | Status: AC
  Filled 2023-05-04: qty 10

## 2023-05-04 MED ORDER — LIDOCAINE HCL (PF) 1 % IJ SOLN
INTRAMUSCULAR | Status: DC | PRN
  Administered 2023-05-04: 5 mL via SUBCUTANEOUS

## 2023-05-04 MED ORDER — FENTANYL CITRATE (PF) 100 MCG/2ML IJ SOLN
INTRAMUSCULAR | Status: AC
  Filled 2023-05-04: qty 2

## 2023-05-04 MED ORDER — HEPARIN (PORCINE) IN NACL 2000-0.9 UNIT/L-% IV SOLN
INTRAVENOUS | Status: DC | PRN
  Administered 2023-05-04: 1000 mL

## 2023-05-04 MED ORDER — ONDANSETRON HCL 4 MG/2ML IJ SOLN: 4.0000 mg | Freq: Four times a day (QID) | INTRAMUSCULAR | Status: DC | PRN

## 2023-05-04 MED ORDER — VERAPAMIL HCL 2.5 MG/ML IV SOLN
INTRAVENOUS | Status: DC | PRN
  Administered 2023-05-04: 2.5 mg via INTRAVENOUS

## 2023-05-04 MED ORDER — FENTANYL CITRATE (PF) 100 MCG/2ML IJ SOLN
INTRAMUSCULAR | Status: DC | PRN
  Administered 2023-05-04: 50 ug via INTRAVENOUS

## 2023-05-04 MED ORDER — VERAPAMIL HCL 2.5 MG/ML IV SOLN
INTRAVENOUS | Status: AC
  Filled 2023-05-04: qty 2

## 2023-05-04 MED ORDER — LIDOCAINE HCL 1 % IJ SOLN
INTRAMUSCULAR | Status: AC
  Filled 2023-05-04: qty 20

## 2023-05-04 MED ORDER — MIDAZOLAM HCL 2 MG/2ML IJ SOLN
INTRAMUSCULAR | Status: AC
  Filled 2023-05-04: qty 2

## 2023-05-04 SURGICAL SUPPLY — 19 items
CANNULA 5F STIFF (CANNULA) IMPLANT
CATH BALLN WEDGE 5F 110CM (CATHETERS) IMPLANT
CATH INFINITI 5FR JK (CATHETERS) IMPLANT
CATH INFINITI JR4 5F (CATHETERS) IMPLANT
CATH SWAN GANZ 7F STRAIGHT (CATHETERS) IMPLANT
DEVICE CLOSURE MYNXGRIP 6/7F (Vascular Products) IMPLANT
DEVICE RAD TR BAND REGULAR (VASCULAR PRODUCTS) IMPLANT
DRAPE BRACHIAL (DRAPES) IMPLANT
GLIDESHEATH SLEND SS 6F .021 (SHEATH) IMPLANT
GUIDEWIRE EMER 3M J .025X150CM (WIRE) IMPLANT
GUIDEWIRE INQWIRE 1.5J.035X260 (WIRE) IMPLANT
INQWIRE 1.5J .035X260CM (WIRE) ×1 IMPLANT
PACK CARDIAC CATH (CUSTOM PROCEDURE TRAY) ×1 IMPLANT
PROTECTION STATION PRESSURIZED (MISCELLANEOUS) ×1 IMPLANT
SET ATX-X65L (MISCELLANEOUS) IMPLANT
SHEATH AVANTI 7FRX11 (SHEATH) IMPLANT
SHEATH GLIDE SLENDER 4/5FR (SHEATH) IMPLANT
STATION PROTECTION PRESSURIZED (MISCELLANEOUS) IMPLANT
WIRE RUNTHROUGH .014X180CM (WIRE) IMPLANT

## 2023-05-04 NOTE — Progress Notes (Signed)
  Progress Note   Patient: Richard Juarez:[id number] DOB: 1986-10-09 DOA: 05/03/2023     1 DOS: the patient was seen and examined on 05/04/2023   Brief hospital course: GOTTI KIRSCH is a 37 y.o. 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 BNP 611, troponin peak 159. CTA negative for PE, but showed cardiomegaly, left ventricular dilation and interstitial pulm edema.  Patient is placed on iv lasix and heparin drip. Echocardiogram showed normal ejection fraction with severe aortic stenosis.  Cardiology consult obtained. Hear cath performed 7/1 did not show significant coronary disease.   Principal Problem:   Acute CHF (congestive heart failure) (HCC) Active Problems:   NSTEMI (non-ST elevated myocardial infarction) (HCC)   PSVT (paroxysmal supraventricular tachycardia)   Hypokalemia   Acute pulmonary edema (HCC)   Elevated troponin   Overweight (BMI 25.0-29.9)   Aortic valve stenosis   Assessment and Plan: Acute on chronic diastolic congestive heart failure. 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 Elevated troponin secondary to congestive heart failure. Patient does not seem to have coronary disease, he had a acute on chronic diastolic congestive heart failure.  He had a bovine valve replacement in 2008, echocardiogram and heart cath showed severe aortic stenosis. Discussed with Dr. Kirke Corin, patient be transferred to tertiary hospital for valve replacement.  Most likely this will happen tomorrow. Continue IV furosemide.  Hypokalemia. Repleted.     Subjective:  Patient states post heart cath, doing well, no chest pain.  Short of breath much better.  Physical Exam: Vitals:   05/04/23 1348 05/04/23 1353 05/04/23 1358 05/04/23 1413  BP: 107/73 103/78 108/76 102/69  Pulse: 72 73 73 74  Resp: 14 13 (!) 21 20   Temp:      TempSrc:      SpO2: 99% 97% 97% 96%  Weight:      Height:       General exam: Appears calm and comfortable  Respiratory system: Clear to auscultation. Respiratory effort normal. Cardiovascular system: S1 & S2 heard, RRR. 2/6SM RUSB with recuded S2.  No pedal edema. Gastrointestinal system: Abdomen is nondistended, soft and nontender. No organomegaly or masses felt. Normal bowel sounds heard. Central nervous system: Alert and oriented. No focal neurological deficits. Extremities: Symmetric 5 x 5 power. Skin: No rashes, lesions or ulcers Psychiatry: Judgement and insight appear normal. Mood & affect appropriate.    Data Reviewed:  Reviewed CT scan results, heart cath results, lab results.  Family Communication: None  Disposition: Status is: Inpatient Remains inpatient appropriate because: Severity of disease, IV treatment.     Time spent: 35 minutes  Author: Marrion Coy, MD 05/04/2023 2:26 PM  For on call review [email or web address].

## 2023-05-04 NOTE — Interval H&P Note (Signed)
History and Physical Interval Note:  05/04/2023 12:51 PM  Richard Juarez  has presented today for surgery, with the diagnosis of severe aortic stenosis.  The various methods of treatment have been discussed with the patient and family. After consideration of risks, benefits and other options for treatment, the patient has consented to  Procedure(s): RIGHT/LEFT HEART CATH AND CORONARY ANGIOGRAPHY (N/A) as a surgical intervention.  The patient's history has been reviewed, patient examined, no change in status, stable for surgery.  I have reviewed the patient's chart and labs.  Questions were answered to the patient's satisfaction.     Lorine Bears

## 2023-05-04 NOTE — Hospital Course (Addendum)
Richard Juarez is a 37 y.o. 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 BNP 611, troponin peak 159. CTA negative for PE, but showed cardiomegaly, left ventricular dilation and interstitial pulm edema.  Patient is placed on iv lasix and heparin drip. Echocardiogram showed normal ejection fraction with severe aortic stenosis.  Cardiology consult obtained. Hear cath performed 7/1 did not show significant coronary disease.

## 2023-05-05 ENCOUNTER — Encounter: Payer: Self-pay | Admitting: Cardiovascular Disease

## 2023-05-05 DIAGNOSIS — I5031 Acute diastolic (congestive) heart failure: Secondary | ICD-10-CM

## 2023-05-05 LAB — CBC
HCT: 47 % (ref 39.0–52.0)
Hemoglobin: 14.7 g/dL (ref 13.0–17.0)
MCH: 30.2 pg (ref 26.0–34.0)
MCHC: 31.3 g/dL (ref 30.0–36.0)
MCV: 96.7 fL (ref 80.0–100.0)
Platelets: 214 10*3/uL (ref 150–400)
RBC: 4.86 MIL/uL (ref 4.22–5.81)
RDW: 13.2 % (ref 11.5–15.5)
WBC: 8 10*3/uL (ref 4.0–10.5)
nRBC: 0 % (ref 0.0–0.2)

## 2023-05-05 LAB — BASIC METABOLIC PANEL
Anion gap: 11 (ref 5–15)
BUN: 16 mg/dL (ref 6–20)
CO2: 21 mmol/L — ABNORMAL LOW (ref 22–32)
Calcium: 8.2 mg/dL — ABNORMAL LOW (ref 8.9–10.3)
Chloride: 104 mmol/L (ref 98–111)
Creatinine, Ser: 0.82 mg/dL (ref 0.61–1.24)
GFR, Estimated: >60 mL/min (ref >60)
Glucose, Bld: 95 mg/dL (ref 70–99)
Potassium: 3.9 mmol/L (ref 3.5–5.1)
Sodium: 136 mmol/L (ref 135–145)

## 2023-05-05 LAB — POCT I-STAT EG7
Acid-Base Excess: 5 mmol/L — ABNORMAL HIGH (ref 0.0–2.0)
Bicarbonate: 29.8 mmol/L — ABNORMAL HIGH (ref 20.0–28.0)
Calcium, Ion: 1.2 mmol/L (ref 1.15–1.40)
HCT: 43 % (ref 39.0–52.0)
Hemoglobin: 14.6 g/dL (ref 13.0–17.0)
O2 Saturation: 71 %
Potassium: 3.7 mmol/L (ref 3.5–5.1)
Sodium: 140 mmol/L (ref 135–145)
TCO2: 31 mmol/L (ref 22–32)
pCO2, Ven: 43.6 mmHg — ABNORMAL LOW (ref 44–60)
pH, Ven: 7.443 — ABNORMAL HIGH (ref 7.25–7.43)
pO2, Ven: 36 mmHg (ref 32–45)

## 2023-05-05 LAB — MAGNESIUM: Magnesium: 2.1 mg/dL (ref 1.7–2.4)

## 2023-05-05 LAB — HEMOGLOBIN A1C
Hgb A1c MFr Bld: 5.4 % (ref 4.8–5.6)
Mean Plasma Glucose: 108 mg/dL

## 2023-05-05 MED ORDER — ASPIRIN 81 MG PO TBEC: 81.0000 mg | DELAYED_RELEASE_TABLET | Freq: Every day | ORAL | 12 refills | Status: AC

## 2023-05-05 MED ORDER — ATORVASTATIN CALCIUM 40 MG PO TABS: 40.0000 mg | ORAL_TABLET | Freq: Every day | ORAL | Status: AC

## 2023-05-05 MED ORDER — FUROSEMIDE 10 MG/ML IJ SOLN: 20.0000 mg | Freq: Two times a day (BID) | INTRAMUSCULAR | 0 refills | Status: AC

## 2023-05-05 NOTE — Discharge Summary (Signed)
Physician Discharge Summary   Patient: Richard Juarez MRN: [id number] DOB: 05-30-1986  Admit date:     05/03/2023  Discharge date: 05/05/23  Discharge Physician: Delfino Lovett   PCP: Patient, No Pcp Per   Recommendations at discharge:    [facility]  Discharge Diagnoses: Principal Problem:   Acute CHF (congestive heart failure) (HCC) Active Problems:   NSTEMI (non-ST elevated myocardial infarction) (HCC)   PSVT (paroxysmal supraventricular tachycardia)   Hypokalemia   Acute pulmonary edema (HCC)   Elevated troponin   Overweight (BMI 25.0-29.9)   Aortic valve stenosis  Hospital Course: Richard Juarez is a 37 y.o. 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 BNP 611, troponin peak 159. CTA negative for PE, but showed cardiomegaly, left ventricular dilation and interstitial pulm edema.  Patient is placed on iv lasix and heparin drip. Echocardiogram showed normal ejection fraction with severe aortic stenosis.  Cardiology consult obtained. Hear cath performed 7/1 did not show significant coronary disease.  Assessment and Plan:  Acute on chronic diastolic congestive heart failure. 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 Elevated troponin secondary to congestive heart failure. Patient does not seem to have coronary disease, he had a acute on chronic diastolic congestive heart failure.  He had a bovine valve replacement in 2008, echocardiogram and heart cath showed severe aortic stenosis. Discussed with Dr. Nicole Kindred, patient is being transferred to [facility] for CT surgery.  He will need redo surgical valve replacement Accepting MD is Yaakov Guthrie   Hypokalemia. Repleted.          Consultants: Cardiology Procedures performed: Cardiac catheterization Disposition:  [facility] Diet recommendation:   Discharge Diet Orders (From admission, onward)     Start     Ordered   05/05/23 0000  Diet - low sodium heart healthy        05/05/23 1140           Cardiac diet DISCHARGE MEDICATION: Allergies as of 05/05/2023   No Known Allergies      Medication List     STOP taking these medications    ibuprofen 800 MG tablet Commonly known as: ADVIL   oxyCODONE-acetaminophen 5-325 MG tablet Commonly known as: PERCOCET/ROXICET       TAKE these medications    aspirin EC 81 MG tablet Take 1 tablet (81 mg total) by mouth daily. Swallow whole. Start taking on: May 06, 2023   atorvastatin 40 MG tablet Commonly known as: LIPITOR Take 1 tablet (40 mg total) by mouth daily. Start taking on: May 06, 2023   furosemide 10 MG/ML injection Commonly known as: LASIX Inject 2 mLs (20 mg total) into the vein every 12 (twelve) hours.        Discharge Exam: Filed Weights   05/03/23 1552 05/04/23 0448 05/05/23 0430  Weight: 115.5 kg 113.4 kg 113.7 kg   General exam: Appears calm and comfortable  Respiratory system: Clear to auscultation. Respiratory effort normal. Cardiovascular system: S1 & S2 heard, RRR. 2/6SM RUSB with recuded S2.  No pedal edema. Gastrointestinal system: Abdomen is nondistended, soft and nontender. No organomegaly or masses felt. Normal bowel sounds heard. Central nervous system: Alert and oriented. No focal neurological deficits. Extremities: Symmetric 5 x 5 power. Skin: No rashes, lesions or ulcers Psychiatry: Judgement and insight appear normal. Mood & affect appropriate.   Condition at discharge: fair  The  results of significant diagnostics from this hospitalization (including imaging, microbiology, ancillary and laboratory) are listed below for reference.   Imaging Studies: CARDIAC CATHETERIZATION  Result Date: 05/04/2023 1.  Normal coronary arteries. 2.  Severe aortic stenosis by echo.  I did not attempt to cross the aortic valve. 3.  Right heart  catheterization showed normal RA pressure, moderately elevated wedge pressure, moderate pulmonary hypertension and normal cardiac output. Recommendations: Continue gentle IV diuresis given persistently elevated wedge pressure at 24 mmHg. Considering degree of aortic stenosis and associated heart failure, the patient should be evaluated for aortic valve replacement during this admission.  He is going to discuss with his family and decide where he wants to be transferred.   ECHOCARDIOGRAM COMPLETE  Result Date: 05/03/2023    ECHOCARDIOGRAM REPORT   Patient Name:   Richard Juarez Date of Exam: 05/03/2023 Medical Rec #:  [id number]         Height:       79.0 in Accession #:    [id number]        Weight:       270.5 lb Date of Birth:  [birth year]         BSA:          2.591 m Patient Age:    36 years          BP:           98/71 mmHg Patient Gender: M                 HR:           87 bpm. Exam Location:  [facility] Procedure: 2D Echo, Cardiac Doppler and Color Doppler Indications:    Elevated troponin; congenital heart defect  History:        Patient has no prior history of Echocardiogram examinations.                 CHF, Prior Cardiac Surgery, Aortic Valve Disease; Risk                 Factors:Non-Smoker.                 Aortic Valve: bioprosthetic valve is present in the aortic                 position.  Sonographer:    Dondra Prader RVT RCS Referring Phys: [id number] BRIAN AGBOR-ETANG IMPRESSIONS  1. Left ventricular ejection fraction, by estimation, is 55 to 60%. Left ventricular ejection fraction by PLAX is 59 %. The left ventricle has normal function. The left ventricle has no regional wall motion abnormalities. There is mild left ventricular hypertrophy. Left ventricular diastolic parameters are indeterminate.  2. Right ventricular systolic function is normal. The right ventricular size is normal.  3. Left atrial size was moderately dilated.  4. The mitral valve is normal in structure. Mild mitral valve regurgitation.   5. The aortic valve is calcified. Aortic valve regurgitation is mild. There is a bioprosthetic valve present in the aortic position. Echo findings are consistent with stenosis of the aortic prosthesis. Aortic valve mean gradient measures 59.0 mmHg.  6. Aortic dilatation noted. There is mild dilatation of the ascending aorta, measuring 41 mm.  7. The inferior vena cava is normal in size with <50% respiratory variability, suggesting right atrial pressure of 8 mmHg. FINDINGS  Left Ventricle: Left ventricular ejection fraction, by estimation, is 55 to 60%. Left ventricular ejection fraction by PLAX is 59 %. The left ventricle  has normal function. The left ventricle has no regional wall motion abnormalities. The left ventricular internal cavity size was normal in size. There is mild left ventricular hypertrophy. Left ventricular diastolic parameters are indeterminate. Right Ventricle: The right ventricular size is normal. No increase in right ventricular wall thickness. Right ventricular systolic function is normal. Left Atrium: Left atrial size was moderately dilated. Right Atrium: Right atrial size was normal in size. Pericardium: There is no evidence of pericardial effusion. Mitral Valve: The mitral valve is normal in structure. Mild mitral valve regurgitation. Tricuspid Valve: The tricuspid valve is normal in structure. Tricuspid valve regurgitation is mild. Aortic Valve: The aortic valve is calcified. Aortic valve regurgitation is mild. Aortic regurgitation PHT measures 247 msec. Aortic valve mean gradient measures 59.0 mmHg. Aortic valve peak gradient measures 93.3 mmHg. Aortic valve area, by VTI measures 0.55 cm. There is a bioprosthetic valve present in the aortic position. Pulmonic Valve: The pulmonic valve was not well visualized. Pulmonic valve regurgitation is mild. Aorta: Aortic dilatation noted. There is mild dilatation of the ascending aorta, measuring 41 mm. Venous: The inferior vena cava is normal in  size with less than 50% respiratory variability, suggesting right atrial pressure of 8 mmHg. IAS/Shunts: No atrial level shunt detected by color flow Doppler.  LEFT VENTRICLE PLAX 2D LV EF:         Left            Diastology                ventricular     LV e' medial:  8.05 cm/s                ejection        LV e' lateral: 7.51 cm/s                fraction by                PLAX is 59                %. LVIDd:         5.70 cm LVIDs:         3.90 cm LV PW:         1.10 cm LV IVS:        1.20 cm LVOT diam:     2.00 cm LV SV:         51 LV SV Index:   20 LVOT Area:     3.14 cm  RIGHT VENTRICLE             IVC RV S prime:     11.90 cm/s  IVC diam: 1.90 cm TAPSE (M-mode): 2.5 cm LEFT ATRIUM              Index        RIGHT ATRIUM           Index LA diam:        5.40 cm  2.08 cm/m   RA Area:     20.10 cm LA Vol (A2C):   126.0 ml 48.63 ml/m  RA Volume:   58.60 ml  22.62 ml/m LA Vol (A4C):   122.0 ml 47.09 ml/m LA Biplane Vol: 135.0 ml 52.10 ml/m  AORTIC VALVE                     PULMONIC VALVE AV Area (Vmax):    0.57 cm      PV Vmax:  1.18 m/s AV Area (Vmean):   0.50 cm      PV Peak grad:     5.6 mmHg AV Area (VTI):     0.55 cm      PR End Diast Vel: 5.48 msec AV Vmax:           483.00 cm/s AV Vmean:          366.000 cm/s AV VTI:            0.912 m AV Peak Grad:      93.3 mmHg AV Mean Grad:      59.0 mmHg LVOT Vmax:         87.00 cm/s LVOT Vmean:        58.200 cm/s LVOT VTI:          0.161 m LVOT/AV VTI ratio: 0.18 AI PHT:            247 msec  AORTA Ao Root diam: 2.70 cm Ao Asc diam:  4.10 cm MITRAL VALVE               TRICUSPID VALVE MV Area (PHT): 5.97 cm    TV Peak grad:   14.1 mmHg MV Decel Time: 127 msec    TV Vmax:        1.88 m/s MV A velocity: 45.50 cm/s MV A Prime:    8.9 cm/s    SHUNTS                            Systemic VTI:  0.16 m                            Systemic Diam: 2.00 cm Debbe Odea MD Electronically signed by Debbe Odea MD Signature Date/Time: 05/03/2023/3:59:13 PM     Final    CT Angio Chest PE W and/or Wo Contrast  Result Date: 05/03/2023 CLINICAL DATA:  37 year old male with history of shortness of breath, chest pain and tachycardia. History of polysubstance abuse and vaping. EXAM: CT ANGIOGRAPHY CHEST WITH CONTRAST TECHNIQUE: Multidetector CT imaging of the chest was performed using the standard protocol during bolus administration of intravenous contrast. Multiplanar CT image reconstructions and MIPs were obtained to evaluate the vascular anatomy. RADIATION DOSE REDUCTION: This exam was performed according to the departmental dose-optimization program which includes automated exposure control, adjustment of the mA and/or kV according to patient size and/or use of iterative reconstruction technique. CONTRAST:  OMNIPAQUE IOHEXOL 350 MG/ML SOLN COMPARISON:  No priors. FINDINGS: Cardiovascular: No filling defects in the pulmonary arterial tree to suggest pulmonary embolism. Heart size is mildly enlarged with mild left ventricular dilatation and left atrial dilatation. There is no significant pericardial fluid, thickening or pericardial calcification. No atherosclerotic calcifications are noted in the thoracic aorta or the coronary arteries. Status post median sternotomy for aortic valve replacement with what appears to be a stented bioprosthesis. The valve cusps appear heavily calcified. Ectasia of the ascending thoracic aorta (4.2 cm in diameter). Mediastinum/Nodes: Multiple prominent borderline enlarged mediastinal and hilar lymph nodes are noted (nonspecific), measuring up to 1.3 cm in short axis in the right paratracheal nodal station and right hilar region. Esophagus is unremarkable in appearance. No axillary lymphadenopathy. Lungs/Pleura: Widespread areas of ground-glass attenuation and interlobular septal thickening noted throughout both lungs, most severe throughout the mid to lower lungs. Trace bilateral pleural effusions lying dependently. No confluent  consolidative airspace disease. No  definite suspicious appearing pulmonary nodules or masses are noted. Upper Abdomen: Unremarkable. Musculoskeletal: There are no aggressive appearing lytic or blastic lesions noted in the visualized portions of the skeleton. Median sternotomy wires. Review of the MIP images confirms the above findings. IMPRESSION: 1. No evidence of pulmonary embolism. 2. Cardiomegaly with left ventricular dilatation and left atrial dilatation. The appearance of the lungs is most suggestive of interstitial pulmonary edema. There also trace bilateral pleural effusions. Overall, findings are concerning for potential congestive heart failure. 3. Status post median sternotomy for aortic valve replacement with what appears to be a stented bioprosthesis. Notably, the bioprosthetic valve cusps superior densely calcified on today's examination. Further evaluation with echocardiography is recommended to exclude the possibility of valvular dysfunction. Electronically Signed   By: Trudie Reed M.D.   On: 05/03/2023 05:20   DG Chest 2 View  Result Date: 05/03/2023 CLINICAL DATA:  Shortness of breath EXAM: CHEST - 2 VIEW COMPARISON:  11/26/2014 FINDINGS: Cardiomegaly with mild perihilar/infrahilar edema. No pleural effusion or pneumothorax. Prosthetic valve. Postsurgical changes related to prior CABG. Median sternotomy. IMPRESSION: Cardiomegaly with mild perihilar/infrahilar edema. Electronically Signed   By: Charline Bills M.D.   On: 05/03/2023 03:57    Microbiology: Results for orders placed or performed during the hospital encounter of 05/03/23  SARS Coronavirus 2 by RT PCR (hospital order, performed in [facility] hospital lab) *cepheid single result test* Anterior Nasal Swab     Status: None   Collection Time: 05/03/23  5:11 AM   Specimen: Anterior Nasal Swab  Result Value Ref Range Status   SARS Coronavirus 2 by RT PCR NEGATIVE NEGATIVE Final    Comment: (NOTE) SARS-CoV-2 target nucleic  acids are NOT DETECTED.  The SARS-CoV-2 RNA is generally detectable in upper and lower respiratory specimens during the acute phase of infection. The lowest concentration of SARS-CoV-2 viral copies this assay can detect is 250 copies / mL. A negative result does not preclude SARS-CoV-2 infection and should not be used as the sole basis for treatment or other patient management decisions.  A negative result may occur with improper specimen collection / handling, submission of specimen other than nasopharyngeal swab, presence of viral mutation(s) within the areas targeted by this assay, and inadequate number of viral copies (<250 copies / mL). A negative result must be combined with clinical observations, patient history, and epidemiological information.  Fact Sheet for Patients:   RoadLapTop.co.za  Fact Sheet for Healthcare Providers: http://kim-miller.com/  This test is not yet approved or  cleared by the [place] FDA and has been authorized for detection and/or diagnosis of SARS-CoV-2 by FDA under an Emergency Use Authorization (EUA).  This EUA will remain in effect (meaning this test can be used) for the duration of the COVID-19 declaration under Section 564(b)(1) of the Act, 21 U.S.C. section 360bbb-3(b)(1), unless the authorization is terminated or revoked sooner.  Performed at [facility], [address] [place], [place] [address]     Labs: CBC: Recent Labs  Lab 05/03/23 [phone] 05/04/23 0450 05/04/23 1307 05/05/23 0426  WBC 9.8 13.2*  --  8.0  NEUTROABS 7.6  --   --   --   HGB 14.9 14.3 14.6 14.7  HCT 44.7 43.3 43.0 47.0  MCV 90.3 90.8  --  96.7  PLT 212 228  --  214   Basic Metabolic Panel: Recent Labs  Lab 05/03/23 0356 05/03/23 0511 05/04/23 0450 05/04/23 1307 05/05/23 0426  NA 139  --  139 141 136  K 3.7  --  4.5 3.6 3.9  CL 106  --  104  --  104  CO2 24  --  26  --  21*  GLUCOSE 127*  --   89  --  95  BUN 14  --  14  --  16  CREATININE 0.80  --  0.89  --  0.82  CALCIUM 8.8*  --  9.0  --  8.2*  MG  --  1.8 2.2  --  2.1   Liver Function Tests: Recent Labs  Lab 05/03/23 0356  AST 24  ALT 21  ALKPHOS 85  BILITOT 1.1  PROT 7.2  ALBUMIN 4.0   CBG: No results for input(s): "GLUCAP" in the last 168 hours.  Discharge time spent: greater than 30 minutes.  Signed: Delfino Lovett, MD [facility] 05/05/2023
